# Patient Record
Sex: Female | Born: 1985
Health system: Southern US, Community
[De-identification: ages and names within clinical notes are randomized; demographics above are authoritative.]

## PROBLEM LIST (undated history)

## (undated) ENCOUNTER — Inpatient Hospital Stay (HOSPITAL_COMMUNITY): Payer: Self-pay

## (undated) DIAGNOSIS — F329 Major depressive disorder, single episode, unspecified: Secondary | ICD-10-CM

## (undated) DIAGNOSIS — O468X1 Other antepartum hemorrhage, first trimester: Secondary | ICD-10-CM

## (undated) DIAGNOSIS — O208 Other hemorrhage in early pregnancy: Secondary | ICD-10-CM

## (undated) DIAGNOSIS — F419 Anxiety disorder, unspecified: Secondary | ICD-10-CM

## (undated) DIAGNOSIS — L709 Acne, unspecified: Secondary | ICD-10-CM

## (undated) DIAGNOSIS — O418X10 Other specified disorders of amniotic fluid and membranes, first trimester, not applicable or unspecified: Secondary | ICD-10-CM

## (undated) DIAGNOSIS — N83209 Unspecified ovarian cyst, unspecified side: Secondary | ICD-10-CM

## (undated) HISTORY — DX: Major depressive disorder, single episode, unspecified: F32.9

## (undated) HISTORY — DX: Acne, unspecified: L70.9

## (undated) HISTORY — PX: MOUTH SURGERY: SHX715

## (undated) HISTORY — DX: Anxiety disorder, unspecified: F41.9

---
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
## 2006-06-16 ENCOUNTER — Emergency Department (HOSPITAL_COMMUNITY): Admission: EM | Admit: 2006-06-16 | Discharge: 2006-06-16 | Payer: Self-pay | Admitting: Emergency Medicine

## 2007-04-02 ENCOUNTER — Emergency Department (HOSPITAL_COMMUNITY): Admission: EM | Admit: 2007-04-02 | Discharge: 2007-04-02 | Payer: Self-pay | Admitting: Emergency Medicine

## 2008-07-28 ENCOUNTER — Emergency Department (HOSPITAL_COMMUNITY): Admission: EM | Admit: 2008-07-28 | Discharge: 2008-07-28 | Payer: Self-pay | Admitting: Family Medicine

## 2008-10-11 ENCOUNTER — Emergency Department (HOSPITAL_COMMUNITY): Admission: EM | Admit: 2008-10-11 | Discharge: 2008-10-11 | Payer: Self-pay | Admitting: Emergency Medicine

## 2009-03-28 ENCOUNTER — Emergency Department (HOSPITAL_COMMUNITY): Admission: EM | Admit: 2009-03-28 | Discharge: 2009-03-28 | Payer: Self-pay | Admitting: Family Medicine

## 2009-06-12 ENCOUNTER — Emergency Department (HOSPITAL_COMMUNITY): Admission: EM | Admit: 2009-06-12 | Discharge: 2009-06-12 | Payer: Self-pay | Admitting: Family Medicine

## 2010-08-03 ENCOUNTER — Emergency Department (HOSPITAL_COMMUNITY): Admission: EM | Admit: 2010-08-03 | Discharge: 2010-08-03 | Payer: Self-pay | Admitting: Family Medicine

## 2010-11-02 ENCOUNTER — Emergency Department (HOSPITAL_COMMUNITY)
Admission: EM | Admit: 2010-11-02 | Discharge: 2010-11-02 | Payer: Self-pay | Source: Home / Self Care | Admitting: Family Medicine

## 2011-01-21 LAB — POCT RAPID STREP A (OFFICE): Streptococcus, Group A Screen (Direct): NEGATIVE

## 2011-02-05 ENCOUNTER — Encounter: Payer: Self-pay | Admitting: *Deleted

## 2011-02-19 LAB — POCT RAPID STREP A (OFFICE): Streptococcus, Group A Screen (Direct): POSITIVE — AB

## 2012-11-17 ENCOUNTER — Inpatient Hospital Stay (HOSPITAL_COMMUNITY)
Admission: AD | Admit: 2012-11-17 | Discharge: 2012-11-17 | Disposition: A | Discharge status: Home / Self Care | Payer: 59 | Source: Ambulatory Visit | Attending: Obstetrics & Gynecology | Admitting: Obstetrics & Gynecology

## 2012-11-17 ENCOUNTER — Encounter (HOSPITAL_COMMUNITY): Payer: Self-pay | Admitting: *Deleted

## 2012-11-17 DIAGNOSIS — A084 Viral intestinal infection, unspecified: Secondary | ICD-10-CM

## 2012-11-17 DIAGNOSIS — A088 Other specified intestinal infections: Secondary | ICD-10-CM

## 2012-11-17 DIAGNOSIS — K5289 Other specified noninfective gastroenteritis and colitis: Secondary | ICD-10-CM | POA: Insufficient documentation

## 2012-11-17 DIAGNOSIS — R197 Diarrhea, unspecified: Secondary | ICD-10-CM | POA: Insufficient documentation

## 2012-11-17 DIAGNOSIS — R112 Nausea with vomiting, unspecified: Secondary | ICD-10-CM | POA: Insufficient documentation

## 2012-11-17 DIAGNOSIS — R109 Unspecified abdominal pain: Secondary | ICD-10-CM | POA: Insufficient documentation

## 2012-11-17 HISTORY — DX: Anxiety disorder, unspecified: F41.9

## 2012-11-17 LAB — CBC
HCT: 42.5 % (ref 36.0–46.0)
Hemoglobin: 14.8 g/dL (ref 12.0–15.0)
MCH: 30.1 pg (ref 26.0–34.0)
MCHC: 34.8 g/dL (ref 30.0–36.0)
MCV: 86.6 fL (ref 78.0–100.0)
Platelets: 204 10*3/uL (ref 150–400)
RBC: 4.91 MIL/uL (ref 3.87–5.11)
RDW: 12 % (ref 11.5–15.5)
WBC: 13.3 10*3/uL — ABNORMAL HIGH (ref 4.0–10.5)

## 2012-11-17 LAB — COMPREHENSIVE METABOLIC PANEL
ALT: 13 U/L (ref 0–35)
AST: 17 U/L (ref 0–37)
Albumin: 4 g/dL (ref 3.5–5.2)
Alkaline Phosphatase: 60 U/L (ref 39–117)
BUN: 18 mg/dL (ref 6–23)
CO2: 22 mEq/L (ref 19–32)
Calcium: 10.6 mg/dL — ABNORMAL HIGH (ref 8.4–10.5)
Chloride: 98 mEq/L (ref 96–112)
Creatinine, Ser: 0.62 mg/dL (ref 0.50–1.10)
GFR calc Af Amer: >90 mL/min (ref >90)
GFR calc non Af Amer: >90 mL/min (ref >90)
Glucose, Bld: 180 mg/dL — ABNORMAL HIGH (ref 70–99)
Potassium: 3.3 mEq/L — ABNORMAL LOW (ref 3.5–5.1)
Sodium: 135 mEq/L (ref 135–145)
Total Bilirubin: 1.5 mg/dL — ABNORMAL HIGH (ref 0.3–1.2)
Total Protein: 6.8 g/dL (ref 6.0–8.3)

## 2012-11-17 MED ORDER — DEXTROSE 5 % IN LACTATED RINGERS IV BOLUS
1000.0000 mL | Freq: Once | INTRAVENOUS | Status: AC
  Administered 2012-11-17: 1000 mL via INTRAVENOUS

## 2012-11-17 MED ORDER — ONDANSETRON HCL 4 MG/2ML IJ SOLN
4.0000 mg | Freq: Once | INTRAMUSCULAR | Status: AC
  Administered 2012-11-17: 4 mg via INTRAVENOUS
  Filled 2012-11-17: qty 2

## 2012-11-17 MED ORDER — ONDANSETRON 8 MG PO TBDP: 8.0000 mg | ORAL_TABLET | Freq: Three times a day (TID) | ORAL | Status: DC | PRN

## 2012-11-17 MED ORDER — PROMETHAZINE HCL 25 MG PO TABS: 25.0000 mg | ORAL_TABLET | Freq: Four times a day (QID) | ORAL | Status: DC | PRN

## 2012-11-17 NOTE — MAU Provider Note (Signed)
History     CSN: [id number]  Arrival date and time: 11/17/12 1746   First Provider Initiated Contact with Patient 11/17/12 1818      Chief Complaint  Patient presents with  . Panic Attack  . Emesis  . Diarrhea   HPI Pt is not pregnant- presents with nausea and vomiting and diarrhea.  Pt woke up this morning with nausea and vomiting and has not eaten or drunk anything today.  Pt's family and co-workers have been sick .  She has had some abdominal cramping. Pt had panic attacks with constant vomiting.    Past Medical History  Diagnosis Date  . Anxiety     History reviewed. No pertinent past surgical history.  Family History  Problem Relation Age of Onset  . Other Neg Hx     History  Substance Use Topics  . Smoking status: Never Smoker   . Smokeless tobacco: Not on file  . Alcohol Use: No    Allergies: No Known Allergies  No prescriptions prior to admission    Review of Systems  Constitutional: Negative for fever and chills.  Respiratory: Negative for cough.   Gastrointestinal: Positive for nausea, vomiting, abdominal pain and diarrhea.  Genitourinary: Negative for dysuria, urgency and frequency.   Physical Exam   Blood pressure 96/72, pulse 97, temperature 97.1 F (36.2 C), temperature source Axillary, resp. rate 20, last menstrual period 11/16/2012, SpO2 100.00%.  Physical Exam  Vitals reviewed. Constitutional: She appears well-developed and well-nourished.  HENT:  Head: Normocephalic.  Eyes: Pupils are equal, round, and reactive to light.  Neck: Normal range of motion. Neck supple.  Cardiovascular: Normal rate.   Respiratory: Effort normal. No respiratory distress.  GI: Soft.  Musculoskeletal: Normal range of motion.  Neurological: She is alert.  Skin: Skin is warm and dry.  Psychiatric: She has a normal mood and affect.    MAU Course  Procedures Pt got much relief with 1 liter of D5LR and Zofran 4 mg IV.  Pt has not thrown up or had diarrhea  since she has been in a room. Results for orders placed during the hospital encounter of 11/17/12 (from the past 24 hour(s))  CBC     Status: Abnormal   Collection Time   11/17/12  6:45 PM      Component Value Range   WBC 13.3 (*) 4.0 - 10.5 K/uL   RBC 4.91  3.87 - 5.11 MIL/uL   Hemoglobin 14.8  12.0 - 15.0 g/dL   HCT 08.6  57.8 - 46.9 %   MCV 86.6  78.0 - 100.0 fL   MCH 30.1  26.0 - 34.0 pg   MCHC 34.8  30.0 - 36.0 g/dL   RDW 62.9  52.8 - 41.3 %   Platelets 204  150 - 400 K/uL  COMPREHENSIVE METABOLIC PANEL     Status: Abnormal   Collection Time   11/17/12  6:45 PM      Component Value Range   Sodium 135  135 - 145 mEq/L   Potassium 3.3 (*) 3.5 - 5.1 mEq/L   Chloride 98  96 - 112 mEq/L   CO2 22  19 - 32 mEq/L   Glucose, Bld 180 (*) 70 - 99 mg/dL   BUN 18  6 - 23 mg/dL   Creatinine, Ser 2.44  0.50 - 1.10 mg/dL   Calcium 01.0 (*) 8.4 - 10.5 mg/dL   Total Protein 6.8  6.0 - 8.3 g/dL   Albumin 4.0  3.5 - 5.2  g/dL   AST 17  0 - 37 U/L   ALT 13  0 - 35 U/L   Alkaline Phosphatase 60  39 - 117 U/L   Total Bilirubin 1.5 (*) 0.3 - 1.2 mg/dL   GFR calc non Af Amer >90  >90 mL/min   GFR calc Af Amer >90  >90 mL/min  pt could not obtain a urine specimen Assessment and Plan  Gastroenteritis BRAT diet Prescriptions for Zofran and Phenergan  Tracee Mccreery 11/17/2012, 7:23 PM

## 2012-11-17 NOTE — MAU Note (Addendum)
Pt calmed down, breathing easy, moving hands and all extremities. Unable to get urine.

## 2012-11-17 NOTE — MAU Note (Signed)
Pt brought to rm from lobby via wc.  States n/v/d started this morning.  Pt states hurts all over. S.O. Has been sick. No fever, not preg.  Pt hyperventilating.her her breath into bag.  "can't move her hands"

## 2012-11-17 NOTE — Progress Notes (Signed)
Written and verbal d/c instructions given and understanding voiced. 

## 2014-04-11 ENCOUNTER — Encounter (INDEPENDENT_AMBULATORY_CARE_PROVIDER_SITE_OTHER): Payer: Self-pay | Admitting: *Deleted

## 2014-04-11 NOTE — Telephone Encounter (Signed)
This encounter was created in error - please disregard.

## 2014-09-12 ENCOUNTER — Encounter (HOSPITAL_COMMUNITY): Payer: Self-pay | Admitting: *Deleted

## 2015-06-15 ENCOUNTER — Encounter (INDEPENDENT_AMBULATORY_CARE_PROVIDER_SITE_OTHER): Payer: Self-pay

## 2015-06-15 ENCOUNTER — Encounter: Payer: Self-pay | Admitting: Internal Medicine

## 2015-06-15 ENCOUNTER — Ambulatory Visit (INDEPENDENT_AMBULATORY_CARE_PROVIDER_SITE_OTHER): Payer: 59 | Admitting: Internal Medicine

## 2015-06-15 VITALS — BP 104/70 | HR 68 | Temp 98.2°F | Ht 64.5 in | Wt 122.0 lb

## 2015-06-15 DIAGNOSIS — F418 Other specified anxiety disorders: Secondary | ICD-10-CM | POA: Diagnosis not present

## 2015-06-15 DIAGNOSIS — L709 Acne, unspecified: Secondary | ICD-10-CM

## 2015-06-15 DIAGNOSIS — F419 Anxiety disorder, unspecified: Secondary | ICD-10-CM

## 2015-06-15 DIAGNOSIS — F32A Depression, unspecified: Secondary | ICD-10-CM

## 2015-06-15 DIAGNOSIS — F329 Major depressive disorder, single episode, unspecified: Secondary | ICD-10-CM | POA: Insufficient documentation

## 2015-06-15 HISTORY — DX: Anxiety disorder, unspecified: F41.9

## 2015-06-15 HISTORY — DX: Acne, unspecified: L70.9

## 2015-06-15 HISTORY — DX: Depression, unspecified: F32.A

## 2015-06-15 NOTE — Progress Notes (Signed)
Pt presents to the clinic today to establish care and for management of the conditions listed below. She has not had a PCP in many years.  Anxiety and Depression: She takes Xanax BID. She follows with Dr. Evelene Croon. They have tried other medications but so far the Xanax has been the only one that has been effective. She did have troubled youth but reports she is trying to work through that. She denies SI/HI.  Acne: She takes Doxycycline as needed. She is supposed to be taking it BID but reports she often forgets. She has noticed improvement in her acne.  Flu: 09/2014 Tetanus: 2012 LMP: 06/13/15. G1P1 Pap Smear: 06/2011 Dentist: as needed  Past Medical History  Diagnosis Date  . Anxiety     Current Outpatient Prescriptions  Medication Sig Dispense Refill  . ALPRAZolam (XANAX) 1 MG tablet Take 1 tablet by mouth 2 (two) times daily as needed.  3  . doxycycline (DORYX) 100 MG DR capsule Take 100 mg by mouth 2 (two) times daily. For Acne    . MONONESSA 0.25-35 MG-MCG tablet Take 1 tablet by mouth daily.  6   No current facility-administered medications for this visit.    No Known Allergies  Family History  Problem Relation Age of Onset  . Other Neg Hx     History   Social History  . Marital Status: Married    Spouse Name: N/A  . Number of Children: N/A  . Years of Education: N/A   Occupational History  . Not on file.   Social History Main Topics  . Smoking status: Never Smoker   . Smokeless tobacco: Never Used  . Alcohol Use: 0.0 oz/week    0 Standard drinks or equivalent per week     Comment: occasional  . Drug Use: No  . Sexual Activity: Yes    Birth Control/ Protection: IUD   Other Topics Concern  . Not on file   Social History Narrative     Constitutional: Denies fever, malaise, fatigue, headache or abrupt weight changes.  Respiratory: Denies difficulty breathing, shortness of breath, cough or sputum production.   Cardiovascular: Denies chest pain, chest  tightness, palpitations or swelling in the hands or feet.   Skin: Pt reports acne. Denies redness, lesions or ulcercations.  Neurological: Denies dizziness, difficulty with memory, difficulty with speech or problems with balance and coordination.  Psych: Pt reports anxiety and depression. Denies SI/HI.  No other specific complaints in a complete review of systems (except as listed in HPI above).   BP 104/70 mmHg  Pulse 68  Temp(Src) 98.2 F (36.8 C) (Oral)  Ht 5' 4.5" (1.638 m)  Wt 122 lb (55.339 kg)  BMI 20.63 kg/m2  SpO2 99%  LMP 06/13/2015 Wt Readings from Last 3 Encounters:  06/15/15 122 lb (55.339 kg)    General: Appears her stated age, well developed, well nourished in NAD. Skin: Warm, dry and intact. Acne noted on face. Cardiovascular: Normal rate and rhythm. S1,S2 noted.  No murmur, rubs or gallops noted. Pulmonary/Chest: Normal effort and positive vesicular breath sounds. No respiratory distress. No wheezes, rales or ronchi noted.   Neurological: Alert and oriented.  Psychiatric: She seems happy. She is talkative and makes great eye contact.   BMET    Component Value Date/Time   NA 135 11/17/2012 1845   K 3.3* 11/17/2012 1845   CL 98 11/17/2012 1845   CO2 22 11/17/2012 1845   GLUCOSE 180* 11/17/2012 1845   BUN 18 11/17/2012 1845  CREATININE 0.62 11/17/2012 1845   CALCIUM 10.6* 11/17/2012 1845   GFRNONAA >90 11/17/2012 1845   GFRAA >90 11/17/2012 1845    Lipid Panel  No results found for: CHOL, TRIG, HDL, CHOLHDL, VLDL, LDLCALC  CBC    Component Value Date/Time   WBC 13.3* 11/17/2012 1845   RBC 4.91 11/17/2012 1845   HGB 14.8 11/17/2012 1845   HCT 42.5 11/17/2012 1845   PLT 204 11/17/2012 1845   MCV 86.6 11/17/2012 1845   MCH 30.1 11/17/2012 1845   MCHC 34.8 11/17/2012 1845   RDW 12.0 11/17/2012 1845    Hgb A1C No results found for: HGBA1C

## 2015-06-15 NOTE — Progress Notes (Signed)
Pre visit review using our clinic review tool, if applicable. No additional management support is needed unless otherwise documented below in the visit note. 

## 2015-06-15 NOTE — Assessment & Plan Note (Signed)
She will continue to follow with Dr. Evelene Croon She will continue Xanax BID

## 2015-06-15 NOTE — Patient Instructions (Signed)
Acne  Acne is a skin problem that causes pimples. Acne occurs when the pores in your skin get blocked. Your pores may become red, sore, and swollen (inflamed), or infected with a common skin bacterium (Propionibacterium acnes). Acne is a common skin problem. Up to 80% of people get acne at some time. Acne is especially common from the ages of 12 to 24. Acne usually goes away over time with proper treatment.  CAUSES   Your pores each contain an oil gland. The oil glands make an oily substance called sebum. Acne happens when these glands get plugged with sebum, dead skin cells, and dirt. The P. acnes bacteria that are normally found in the oil glands then multiply, causing inflammation. Acne is commonly triggered by changes in your hormones. These hormonal changes can cause the oil glands to get bigger and to make more sebum. Factors that can make acne worse include:   Hormone changes during adolescence.   Hormone changes during women's menstrual cycles.   Hormone changes during pregnancy.   Oil-based cosmetics and hair products.   Harshly scrubbing the skin.   Strong soaps.   Stress.   Hormone problems due to certain diseases.   Long or oily hair rubbing against the skin.   Certain medicines.   Pressure from headbands, backpacks, or shoulder pads.   Exposure to certain oils and chemicals.  SYMPTOMS   Acne often occurs on the face, neck, chest, and upper back. Symptoms include:   Small, red bumps (pimples or papules).   Whiteheads (closed comedones).   Blackheads (open comedones).   Small, pus-filled pimples (pustules).   Big, red pimples or pustules that feel tender.  More severe acne can cause:   An infected area that contains a collection of pus (abscess).   Hard, painful, fluid-filled sacs (cysts).   Scars.  DIAGNOSIS   Your caregiver can usually tell what the problem is by doing a physical exam.  TREATMENT   There are many good treatments for acne. Some are available over the counter and some  are available with a prescription. The treatment that is best for you depends on the type of acne you have and how severe it is. It may take 2 months of treatment before your acne gets better. Common treatments include:   Creams and lotions that prevent oil glands from clogging.   Creams and lotions that treat or prevent infections and inflammation.   Antibiotics applied to the skin or taken as a pill.   Pills that decrease sebum production.   Birth control pills.   Light or laser treatments.   Minor surgery.   Injections of medicine into the affected areas.   Chemicals that cause peeling of the skin.  HOME CARE INSTRUCTIONS   Good skin care is the most important part of treatment.   Wash your skin gently at least twice a day and after exercise. Always wash your skin before bed.   Use mild soap.   After each wash, apply a water-based skin moisturizer.   Keep your hair clean and off of your face. Shampoo your hair daily.   Only take medicines as directed by your caregiver.   Use a sunscreen or sunblock with SPF 30 or greater. This is especially important when you are using acne medicines.   Choose cosmetics that are noncomedogenic. This means they do not plug the oil glands.   Avoid leaning your chin or forehead on your hands.   Avoid wearing tight headbands or hats.     Avoid picking or squeezing your pimples. This can make your acne worse and cause scarring.  SEEK MEDICAL CARE IF:    Your acne is not better after 8 weeks.   Your acne gets worse.   You have a large area of skin that is red or tender.  Document Released: 10/25/2000 Document Revised: 03/14/2014 Document Reviewed: 08/16/2011  ExitCare Patient Information 2015 [employer]. This information is not intended to replace advice given to you by your health care provider. Make sure you discuss any questions you have with your health care provider.

## 2015-06-15 NOTE — Assessment & Plan Note (Signed)
Encouraged her to try to remember to take her Doxy BID Discussed sun sensitivity while taking Doxy

## 2015-08-30 ENCOUNTER — Telehealth: Payer: Self-pay | Admitting: Internal Medicine

## 2015-08-30 NOTE — Telephone Encounter (Signed)
Patient Name: Anna [name]  DOB: 1986-01-28    Initial Comment Caller states she has been havinShela Commonsg sinus issues for a while. Has tried OTC medications and they arent working. Headaches, earaches, a little facial pain.    Nurse Assessment  Nurse: Annye Englisharmon, RN, Denise Date/Time (Eastern Time): 08/30/2015 4:27:50 PM  Confirm and document reason for call. If symptomatic, describe symptoms. ---Caller states she has been having sinus issues for a while. Has tried OTC medications and they arent working. Headaches, earaches, a little facial pain.  Has the patient traveled out of the country within the last 30 days? ---Not Applicable  Does the patient have any new or worsening symptoms? ---Yes  Will a triage be completed? ---Yes  Related visit to physician within the last 2 weeks? ---No  Does the PT have any chronic conditions? (i.e. diabetes, asthma, etc.) ---No  Did the patient indicate they were pregnant? ---No     Guidelines    Guideline Title Affirmed Question Affirmed Notes  Sinus Pain or Congestion [1] SEVERE pain AND [2] not improved 2 hours after pain medicine    Final Disposition User   See Physician within 4 Hours (or PCP triage) Annye Englisharmon, RN, Angelique Blonderenise    Comments  [facility]. States she will call in the am to get an appt to be eval at the [facility].   Referrals  GO TO FACILITY REFUSED   Disagree/Comply: Comply

## 2015-10-30 ENCOUNTER — Telehealth: Payer: Self-pay | Admitting: Internal Medicine

## 2015-10-30 NOTE — Telephone Encounter (Signed)
Pt has appt 10/31/15 at 9 AM with R Baity NP.

## 2015-10-30 NOTE — Telephone Encounter (Signed)
[facility] Client TELEPHONE ADVICE RECORD [facility]  Patient Name: Anna [name]  DOB: 1985/12/31    Initial Comment Caller states has had cold s/s for 2 months.    Nurse Assessment  Nurse: Laural BenesJohnson, RN, Dondra SpryGail Date/Time Lamount Cohen(Eastern Time): 10/30/2015 10:33:35 AM  Confirm and document reason for call. If symptomatic, describe symptoms. ---[name] has drainage and cough developed congestion runny nose with nose bleed x 3 days never had high temp then started having a new symptoms -- now has cough, congestion no fever and onset two months sore throat with this also has rash on face  Has the patient traveled out of the country within the last 30 days? ---No  Does the patient have any new or worsening symptoms? ---Yes  Will a triage be completed? ---Yes  Related visit to physician within the last 2 weeks? ---No  Does the PT have any chronic conditions? (i.e. diabetes, asthma, etc.) ---Unknown  Did the patient indicate they were pregnant? ---No  Is this a behavioral health or substance abuse call? ---No     Guidelines    Guideline Title Affirmed Question Affirmed Notes  Cough - Chronic Change in color of sputum (e.g., from white to yellow-green sputum)    Final Disposition User   See Physician within 24 Hours [place]Johnson, RN, Dondra SpryGail    Comments  NOTE APPT FOR 10-31-2015 9AM Nicki ReaperEGINA BAITY, NP   Referrals  REFERRED TO PCP OFFICE   Disagree/Comply: Comply

## 2015-10-31 ENCOUNTER — Ambulatory Visit (INDEPENDENT_AMBULATORY_CARE_PROVIDER_SITE_OTHER): Payer: 59 | Admitting: Internal Medicine

## 2015-10-31 ENCOUNTER — Encounter: Payer: Self-pay | Admitting: Internal Medicine

## 2015-10-31 VITALS — BP 104/68 | HR 68 | Temp 97.8°F | Wt 119.0 lb

## 2015-10-31 DIAGNOSIS — J309 Allergic rhinitis, unspecified: Secondary | ICD-10-CM

## 2015-10-31 MED ORDER — PREDNISONE 10 MG PO TABS: ORAL_TABLET | ORAL | Status: DC

## 2015-10-31 NOTE — Progress Notes (Signed)
Pre visit review using our clinic review tool, if applicable. No additional management support is needed unless otherwise documented below in the visit note. 

## 2015-10-31 NOTE — Progress Notes (Signed)
Subjective:    Patient ID: Anna [name], female    DOB: 11-30-1985, 29 y.o.   MRN: [id number]  HPI  Pt presents to the clinic today with c/o runny nose, sore throat and cough. This is persistent over the las 2 months. It has not gotten worse but she reports it will not go away. She is blowing clear, bloody mucous out of her nose. The cough is nonproductive. She denies fever or chills but had body aches. She has tried Claritin, Flonase, Sudafed and Tylenol with minimal relief. She has no history of allergies that she is aware of. She has not had sick contact.  Review of Systems      Past Medical History  Diagnosis Date  . Anxiety     Current Outpatient Prescriptions  Medication Sig Dispense Refill  . MONONESSA 0.25-35 MG-MCG tablet Take 1 tablet by mouth daily.  6  . predniSONE (DELTASONE) 10 MG tablet Take 3 tabs on days 1-3, take 2 tabs on days 4-6, take 1 tab on days 7-9 18 tablet 0   No current facility-administered medications for this visit.    No Known Allergies  Family History  Problem Relation Age of Onset  . Other Neg Hx     Social History   Social History  . Marital Status: Married    Spouse Name: N/A  . Number of Children: N/A  . Years of Education: N/A   Occupational History  . Not on file.   Social History Main Topics  . Smoking status: Never Smoker   . Smokeless tobacco: Never Used  . Alcohol Use: 0.0 oz/week    0 Standard drinks or equivalent per week     Comment: occasional  . Drug Use: No  . Sexual Activity: Yes    Birth Control/ Protection: IUD, Pill   Other Topics Concern  . Not on file   Social History Narrative     Constitutional: Denies fever, malaise, fatigue, headache or abrupt weight changes.  HEENT: Pt reports runny nose and sore throat. Denies eye pain, eye redness, ear pain, ringing in the ears, wax buildup, nasal congestion, bloody nose. Respiratory: Pt reports cough. Denies difficulty breathing, shortness of breath, or  sputum production.   Cardiovascular: Denies chest pain, chest tightness, palpitations or swelling in the hands or feet.   No other specific complaints in a complete review of systems (except as listed in HPI above).  Objective:   Physical Exam  BP 104/68 mmHg  Pulse 68  Temp(Src) 97.8 F (36.6 C) (Oral)  Wt 119 lb (53.978 kg)  LMP 10/09/2015 Wt Readings from Last 3 Encounters:  10/31/15 119 lb (53.978 kg)  06/15/15 122 lb (55.339 kg)    General: Appears her stated age, well developed, well nourished in NAD. HEENT: Head: normal shape and size, no sinus tenderness noted; Eyes: sclera white, no icterus, conjunctiva pink; Ears: Tm's pink but intact, normal light reflex, + serous effusion bilaterally; Nose: mucosa pink and moist, septum midline; Throat/Mouth: Teeth present, mucosa pink and moist, + PND, no exudate, lesions or ulcerations noted.  Neck:  No adenopathy noted. Cardiovascular: Normal rate and rhythm. S1,S2 noted.  No murmur, rubs or gallops noted.  Pulmonary/Chest: Normal effort and positive vesicular breath sounds. No respiratory distress. No wheezes, rales or ronchi noted.   BMET    Component Value Date/Time   NA 135 11/17/2012 1845   K 3.3* 11/17/2012 1845   CL 98 11/17/2012 1845   CO2 22 11/17/2012 1845  GLUCOSE 180* 11/17/2012 1845   BUN 18 11/17/2012 1845   CREATININE 0.62 11/17/2012 1845   CALCIUM 10.6* 11/17/2012 1845   GFRNONAA >90 11/17/2012 1845   GFRAA >90 11/17/2012 1845    Lipid Panel  No results found for: CHOL, TRIG, HDL, CHOLHDL, VLDL, LDLCALC  CBC    Component Value Date/Time   WBC 13.3* 11/17/2012 1845   RBC 4.91 11/17/2012 1845   HGB 14.8 11/17/2012 1845   HCT 42.5 11/17/2012 1845   PLT 204 11/17/2012 1845   MCV 86.6 11/17/2012 1845   MCH 30.1 11/17/2012 1845   MCHC 34.8 11/17/2012 1845   RDW 12.0 11/17/2012 1845    Hgb A1C No results found for: HGBA1C       Assessment & Plan:   Allergic Rhinitis:  Failed antihistamine and  nasal steroid Try switching from Claritin to Allegra eRx for Pred Taper x 9 days Return precautions given  RTC as needed or if symptoms persist or worsen

## 2015-10-31 NOTE — Patient Instructions (Signed)
Allergic Rhinitis Allergic rhinitis is when the mucous membranes in the nose respond to allergens. Allergens are particles in the air that cause your body to have an allergic reaction. This causes you to release allergic antibodies. Through a chain of events, these eventually cause you to release histamine into the blood stream. Although meant to protect the body, it is this release of histamine that causes your discomfort, such as frequent sneezing, congestion, and an itchy, runny nose.  CAUSES Seasonal allergic rhinitis (hay fever) is caused by pollen allergens that may come from grasses, trees, and weeds. Year-round allergic rhinitis (perennial allergic rhinitis) is caused by allergens such as house dust mites, pet dander, and mold spores. SYMPTOMS  Nasal stuffiness (congestion).  Itchy, runny nose with sneezing and tearing of the eyes. DIAGNOSIS Your health care provider can help you determine the allergen or allergens that trigger your symptoms. If you and your health care provider are unable to determine the allergen, skin or blood testing may be used. Your health care provider will diagnose your condition after taking your health history and performing a physical exam. Your health care provider may assess you for other related conditions, such as asthma, pink eye, or an ear infection. TREATMENT Allergic rhinitis does not have a cure, but it can be controlled by:  Medicines that block allergy symptoms. These may include allergy shots, nasal sprays, and oral antihistamines.  Avoiding the allergen. Hay fever may often be treated with antihistamines in pill or nasal spray forms. Antihistamines block the effects of histamine. There are over-the-counter medicines that may help with nasal congestion and swelling around the eyes. Check with your health care provider before taking or giving this medicine. If avoiding the allergen or the medicine prescribed do not work, there are many new medicines  your health care provider can prescribe. Stronger medicine may be used if initial measures are ineffective. Desensitizing injections can be used if medicine and avoidance does not work. Desensitization is when a patient is given ongoing shots until the body becomes less sensitive to the allergen. Make sure you follow up with your health care provider if problems continue. HOME CARE INSTRUCTIONS It is not possible to completely avoid allergens, but you can reduce your symptoms by taking steps to limit your exposure to them. It helps to know exactly what you are allergic to so that you can avoid your specific triggers. SEEK MEDICAL CARE IF:  You have a fever.  You develop a cough that does not stop easily (persistent).  You have shortness of breath.  You start wheezing.  Symptoms interfere with normal daily activities.   This information is not intended to replace advice given to you by your health care provider. Make sure you discuss any questions you have with your health care provider.   Document Released: 07/23/2001 Document Revised: 11/18/2014 Document Reviewed: 07/05/2013 Elsevier Interactive Patient Education 2016 [employer]  

## 2016-02-02 DIAGNOSIS — J069 Acute upper respiratory infection, unspecified: Secondary | ICD-10-CM | POA: Diagnosis not present

## 2016-02-02 DIAGNOSIS — B9689 Other specified bacterial agents as the cause of diseases classified elsewhere: Secondary | ICD-10-CM | POA: Diagnosis not present

## 2016-02-02 DIAGNOSIS — J019 Acute sinusitis, unspecified: Secondary | ICD-10-CM | POA: Diagnosis not present

## 2016-03-26 ENCOUNTER — Telehealth: Payer: Self-pay | Admitting: Internal Medicine

## 2016-03-26 ENCOUNTER — Encounter: Payer: Self-pay | Admitting: Family Medicine

## 2016-03-26 ENCOUNTER — Ambulatory Visit (INDEPENDENT_AMBULATORY_CARE_PROVIDER_SITE_OTHER): Payer: 59 | Admitting: Family Medicine

## 2016-03-26 VITALS — BP 118/70 | HR 80 | Temp 98.0°F | Wt 121.2 lb

## 2016-03-26 DIAGNOSIS — R21 Rash and other nonspecific skin eruption: Secondary | ICD-10-CM | POA: Diagnosis not present

## 2016-03-26 MED ORDER — TRIAMCINOLONE ACETONIDE 0.1 % EX CREA: 1.0000 "application " | TOPICAL_CREAM | Freq: Two times a day (BID) | CUTANEOUS | Status: DC

## 2016-03-26 MED ORDER — PERMETHRIN 5 % EX CREA: 1.0000 "application " | TOPICAL_CREAM | Freq: Once | CUTANEOUS | Status: DC

## 2016-03-26 MED ORDER — PREDNISONE 20 MG PO TABS: ORAL_TABLET | ORAL | Status: DC

## 2016-03-26 NOTE — Assessment & Plan Note (Signed)
Rash may have started after she was outdoors cutting tree at home ?exposure to poison ivy/oak. rec treat with prednisone course + TCI cream. Ok to continue benadryl.  If no better with treatment, will trial permethrin cream to treat possible scabies given puritic nature of condition (although not in typical distribution for scabies).  No systemic sxs.

## 2016-03-26 NOTE — Telephone Encounter (Signed)
Pt has appt with Dr Reece AgarG 03/26/16 at 10:45.

## 2016-03-26 NOTE — Progress Notes (Signed)
   BP 118/70 mmHg  Pulse 80  Temp(Src) 98 F (36.7 C) (Oral)  Wt 121 lb 4 oz (54.999 kg)  LMP 03/10/2016   CC: skin rash  Subjective:    Patient ID: Anna [name], female    DOB: 1986/03/14, 30 y.o.   MRN: [id number]  HPI: Anna CommonsJudith Lawes is a 30 y.o. female presenting on 03/26/2016 for Rash   Several week ago noticed itching of back associated with bumps and burning. Rash on back spread some to neck, chest. No rash on arms or legs. Some finger paresthesias when very itchy.   She has been outside cutting down tree at home but no bonfires, no known poison ivy exposure.  No new foods, no new medicines.  No new lotions, detergents, soaps or shampoos. No new chemical exposures.  No other skin rashes around contacts.  Self treated with benadryl. Also using apple cider vinegar which has helped.   No fevers/chills, nausea, oral lesions.   Relevant past medical, surgical, family and social history reviewed and updated as indicated. Interim medical history since our last visit reviewed. Allergies and medications reviewed and updated. No current outpatient prescriptions on file prior to visit.   No current facility-administered medications on file prior to visit.    Review of Systems Per HPI unless specifically indicated in ROS section     Objective:    BP 118/70 mmHg  Pulse 80  Temp(Src) 98 F (36.7 C) (Oral)  Wt 121 lb 4 oz (54.999 kg)  LMP 03/10/2016  Wt Readings from Last 3 Encounters:  03/26/16 121 lb 4 oz (54.999 kg)  10/31/15 119 lb (53.978 kg)  06/15/15 122 lb (55.339 kg)    Physical Exam  Constitutional: She appears well-developed and well-nourished. No distress.  Musculoskeletal: She exhibits no edema.  Skin: Skin is warm and dry. Rash noted. No erythema.  Diffuse papular rash throughout upper neck/back and anterior upper chest, papules present through chin and behind ears as well. Intensely pruritic. Nonvesicular.  Spares extremities - arms, hands, feet, legs,  and rest of head  Nursing note and vitals reviewed.     Assessment & Plan:   Problem List Items Addressed This Visit    Rash and nonspecific skin eruption - Primary    Rash may have started after she was outdoors cutting tree at home ?exposure to poison ivy/oak. rec treat with prednisone course + TCI cream. Ok to continue benadryl.  If no better with treatment, will trial permethrin cream to treat possible scabies given puritic nature of condition (although not in typical distribution for scabies).  No systemic sxs.          Follow up plan: Return if symptoms worsen or fail to improve.  Eustaquio BoydenJavier Emberlee Sortino, MD

## 2016-03-26 NOTE — Progress Notes (Signed)
Pre visit review using our clinic review tool, if applicable. No additional management support is needed unless otherwise documented below in the visit note. 

## 2016-03-26 NOTE — Telephone Encounter (Signed)
Patient Name: Anna CommonsJUDITH Salay DOB: September 01, 1986 Initial Comment Caller States has bumpy rash, burning, itchy for last 3 wks, back, chest, nape of neck, lower part of face Nurse Assessment Nurse: Yetta BarreJones, RN, Miranda Date/Time (Eastern Time): 03/26/2016 8:55:52 AM Confirm and document reason for call. If symptomatic, describe symptoms. You must click the next button to save text entered. ---Caller states for the last 3 weeks, she has had red bumps on her skin that burn and itch. The rash is on her neck, jaw line, and upper back. Has the patient traveled out of the country within the last 30 days? ---No Does the patient have any new or worsening symptoms? ---Yes Will a triage be completed? ---Yes Related visit to physician within the last 2 weeks? ---No Does the PT have any chronic conditions? (i.e. diabetes, asthma, etc.) ---Yes List chronic conditions. ---Allergies Is the patient pregnant or possibly pregnant? (Ask all females between the ages of 5212-55) ---No Is this a behavioral health or substance abuse call? ---No Guidelines Guideline Title Affirmed Question Affirmed Notes Rash or Redness - Localized [1] Localized rash is very painful AND [2] no fever Final Disposition User See Physician within 24 Hours Yetta BarreJones, RN, Miranda Comments Appt scheduled with Dr. Sharen HonesGutierrez at 10:45am today at patient's request. No appt available with PCP within recommended time frame. Referrals REFERRED TO PCP OFFICE Disagree/Comply: Comply

## 2016-03-26 NOTE — Patient Instructions (Signed)
I think this is contact dermatitis to something you were exposed to outdoors. Treat with prednisone course as well as topical steroid.  Ok to continue benadryl for itch. Warm water soaks can help itching as well. If no better with this, treat with permethrin cream (printed out today) for possible scabies infection.  Let us know if not improving with treatment.

## 2016-04-01 ENCOUNTER — Telehealth: Payer: Self-pay | Admitting: Internal Medicine

## 2016-04-01 NOTE — Telephone Encounter (Signed)
The rash is not any better.  Patient stated that today is the last day of the Prednisone. Patient wants to know she should continue the cream that she was given first or should she go ahead and use the second cream given for scabies?

## 2016-04-01 NOTE — Telephone Encounter (Signed)
Trial permethrin. If no better would offer derm referral.

## 2016-04-01 NOTE — Telephone Encounter (Signed)
Pt called with questions regarding cream given at previous appt. There hasn't been any change in rash, possibly spreading more. Please call to discuss second cream given. If after 4:30 call [phone].

## 2016-04-02 NOTE — Telephone Encounter (Signed)
Patient notified and verbalized understanding. 

## 2016-04-03 ENCOUNTER — Telehealth: Payer: Self-pay | Admitting: *Deleted

## 2016-04-03 DIAGNOSIS — R21 Rash and other nonspecific skin eruption: Secondary | ICD-10-CM

## 2016-04-03 NOTE — Telephone Encounter (Signed)
Patient called requesting derm referral. Rash seems to be spreading and is more painful. Has used the 2nd round of treatment.

## 2016-04-04 ENCOUNTER — Ambulatory Visit (INDEPENDENT_AMBULATORY_CARE_PROVIDER_SITE_OTHER): Payer: 59 | Admitting: Internal Medicine

## 2016-04-04 ENCOUNTER — Encounter: Payer: Self-pay | Admitting: Internal Medicine

## 2016-04-04 ENCOUNTER — Telehealth: Payer: Self-pay | Admitting: Internal Medicine

## 2016-04-04 VITALS — BP 120/82 | HR 74 | Temp 98.8°F | Wt 120.0 lb

## 2016-04-04 DIAGNOSIS — R21 Rash and other nonspecific skin eruption: Secondary | ICD-10-CM

## 2016-04-04 MED ORDER — TETRACYCLINE HCL 500 MG PO CAPS: 500.0000 mg | ORAL_CAPSULE | Freq: Two times a day (BID) | ORAL | Status: DC

## 2016-04-04 NOTE — Patient Instructions (Signed)

## 2016-04-04 NOTE — Progress Notes (Signed)
Pre visit review using our clinic review tool, if applicable. No additional management support is needed unless otherwise documented below in the visit note. 

## 2016-04-04 NOTE — Telephone Encounter (Signed)
 [facility] - Client TELEPHONE ADVICE RECORD [facility] Patient Name: Anna [name] DOB: 1986-01-06 Initial Comment Caller states c/o widespread rash, very painful Nurse Assessment Nurse: Debera Latalston, RN, Tinnie GensJeffrey Date/Time (Eastern Time): 04/04/2016 8:17:31 AM Confirm and document reason for call. If symptomatic, describe symptoms. You must click the next button to save text entered. ---Caller states c/o widespread rash, very painful. Rash since the first of May. Has been seen by PCP and was prescribed meds. Has the patient traveled out of the country within the last 30 days? ---No Does the patient have any new or worsening symptoms? ---Yes Will a triage be completed? ---Yes Related visit to physician within the last 2 weeks? ---No Does the PT have any chronic conditions? (i.e. diabetes, asthma, etc.) ---No Is the patient pregnant or possibly pregnant? (Ask all females between the ages of 7212-55) ---No Is this a behavioral health or substance abuse call? ---No Guidelines Guideline Title Affirmed Question Affirmed Notes Rash or Redness - Widespread SEVERE itching (i.e., interferes with sleep, normal activities or school) Final Disposition User See Physician within 24 Hours Debera Latalston, RN, Abbott LaboratoriesJeffrey Referrals REFERRED TO PCP OFFICE Disagree/Comply: Danella Maiersomply

## 2016-04-04 NOTE — Progress Notes (Signed)
Subjective:    Patient ID: Anna [name], female    DOB: [birth year], 30 y.o.   MRN: [id number]  HPI  Pt presents to the clinic today with c/o rash. This has been going on for 3 weeks. She first noticed after working out in the yard, but did not come in contact with any known poison ivy/oak. She saw Dr. Sharen [name] for the same 03/26/16- note reviewed. He put her on Prednisone and Triamcinolone for a possible contact dermatitis. She did not notice any improvement, so he sent in Permethrin for possible scabies infection. She has used the Permethrin x 2 and reports the rash continues to spread and is painful. Dr. Sharen [name] placed a referral to dermatology yesterday, but she had not yet scheduled an appointment. She denies changes in soaps, lotions, detergents, medications or diet.  Review of Systems      Past Medical History  Diagnosis Date  . Anxiety     Current Outpatient Prescriptions  Medication Sig Dispense Refill  . fexofenadine (ALLEGRA) 180 MG tablet Take 180 mg by mouth daily.    . norethindrone (NORA-BE) 0.35 MG tablet Take 1 tablet by mouth daily.    . permethrin (ACTICIN) 5 % cream Apply 1 application topically once. (Patient not taking: Reported on 04/04/2016) 60 g 0  . triamcinolone cream (KENALOG) 0.1 % Apply 1 application topically 2 (two) times daily. Apply to AA. Max 2 wks at a time (Patient not taking: Reported on 04/04/2016) 45 g 0   No current facility-administered medications for this visit.    No Known Allergies  Family History  Problem Relation Age of Onset  . Other Neg Hx     Social History   Social History  . Marital Status: Married    Spouse Name: N/A  . Number of Children: N/A  . Years of Education: N/A   Occupational History  . Not on file.   Social History Main Topics  . Smoking status: Never Smoker   . Smokeless tobacco: Never Used  . Alcohol Use: 0.0 oz/week    0 Standard drinks or equivalent per week     Comment: occasional  . Drug Use: No   . Sexual Activity: Yes    Birth Control/ Protection: IUD, Pill   Other Topics Concern  . Not on file   Social History Narrative     Constitutional: Denies fever, malaise, fatigue, headache or abrupt weight changes.  Skin: Pt reports rash. Denies ulcercations.    No other specific complaints in a complete review of systems (except as listed in HPI above).  Objective:   Physical Exam   BP 120/82 mmHg  Pulse 74  Temp(Src) 98.8 F (37.1 C) (Oral)  Wt 120 lb (54.432 kg)  SpO2 98%  LMP 03/10/2016 Wt Readings from Last 3 Encounters:  04/04/16 120 lb (54.432 kg)  03/26/16 121 lb 4 oz (54.999 kg)  10/31/15 119 lb (53.978 kg)    General: Appears her stated age, well developed, well nourished in NAD. Skin: Scattered, pustular lesions of various sizes on erythematous base, noted on neck, back, shoulders and upper chest.  BMET    Component Value Date/Time   NA 135 11/17/2012 1845   K 3.3* 11/17/2012 1845   CL 98 11/17/2012 1845   CO2 22 11/17/2012 1845   GLUCOSE 180* 11/17/2012 1845   BUN 18 11/17/2012 1845   CREATININE 0.62 11/17/2012 1845   CALCIUM 10.6* 11/17/2012 1845   GFRNONAA >90 11/17/2012 1845   GFRAA >90 11/17/2012 1845  Lipid Panel  No results found for: CHOL, TRIG, HDL, CHOLHDL, VLDL, LDLCALC  CBC    Component Value Date/Time   WBC 13.3* 11/17/2012 1845   RBC 4.91 11/17/2012 1845   HGB 14.8 11/17/2012 1845   HCT 42.5 11/17/2012 1845   PLT 204 11/17/2012 1845   MCV 86.6 11/17/2012 1845   MCH 30.1 11/17/2012 1845   MCHC 34.8 11/17/2012 1845   RDW 12.0 11/17/2012 1845    Hgb A1C No results found for: HGBA1C      Assessment & Plan:   Rash:  Seems like inflammatory acne No improvement with steroid cream, oral steroid and Permethrin eRx for Tetracycline 500 mg BID x 10 days See Shirlee LimerickMarion on the way out to set up your dermatology referral in case symptoms persist  RTC as needed or if symptoms persist or worsen

## 2016-04-04 NOTE — Telephone Encounter (Signed)
Referral placed.

## 2016-04-04 NOTE — Telephone Encounter (Signed)
Pt has appt with Pamala Hurry Baity NP on 04/04/16 at 10:30.

## 2016-04-19 DIAGNOSIS — L308 Other specified dermatitis: Secondary | ICD-10-CM | POA: Diagnosis not present

## 2016-06-12 ENCOUNTER — Other Ambulatory Visit: Payer: 59

## 2016-06-17 ENCOUNTER — Encounter: Payer: 59 | Admitting: Internal Medicine

## 2016-06-17 ENCOUNTER — Telehealth: Payer: Self-pay | Admitting: Internal Medicine

## 2016-06-17 DIAGNOSIS — Z0289 Encounter for other administrative examinations: Secondary | ICD-10-CM

## 2016-06-17 DIAGNOSIS — L700 Acne vulgaris: Secondary | ICD-10-CM | POA: Diagnosis not present

## 2016-06-17 NOTE — Telephone Encounter (Signed)
Patient did not come in for their appointment today for cpe.  Please let me know if patient needs to be contacted immediately for follow up or no follow up needed. °

## 2016-06-17 NOTE — Telephone Encounter (Signed)
Yes, she needs to reschedule appt

## 2016-06-18 NOTE — Telephone Encounter (Signed)
Called to rs appt - left message

## 2016-06-19 NOTE — Telephone Encounter (Signed)
Called to rs appt - l/m

## 2016-07-02 ENCOUNTER — Ambulatory Visit (INDEPENDENT_AMBULATORY_CARE_PROVIDER_SITE_OTHER): Payer: 59 | Admitting: Internal Medicine

## 2016-07-02 ENCOUNTER — Encounter: Payer: Self-pay | Admitting: Internal Medicine

## 2016-07-02 ENCOUNTER — Ambulatory Visit: Payer: 59 | Admitting: Internal Medicine

## 2016-07-02 VITALS — BP 108/64 | HR 87 | Temp 98.8°F | Wt 119.0 lb

## 2016-07-02 DIAGNOSIS — T3695XA Adverse effect of unspecified systemic antibiotic, initial encounter: Secondary | ICD-10-CM

## 2016-07-02 DIAGNOSIS — J069 Acute upper respiratory infection, unspecified: Secondary | ICD-10-CM | POA: Diagnosis not present

## 2016-07-02 DIAGNOSIS — B379 Candidiasis, unspecified: Secondary | ICD-10-CM

## 2016-07-02 MED ORDER — FLUCONAZOLE 150 MG PO TABS: 150.0000 mg | ORAL_TABLET | Freq: Once | ORAL | 0 refills | Status: AC

## 2016-07-02 MED ORDER — AZITHROMYCIN 250 MG PO TABS: ORAL_TABLET | ORAL | 0 refills | Status: DC

## 2016-07-02 NOTE — Patient Instructions (Signed)
Upper Respiratory Infection, Adult Most upper respiratory infections (URIs) are a viral infection of the air passages leading to the lungs. A URI affects the nose, throat, and upper air passages. The most common type of URI is nasopharyngitis and is typically referred to as "the common cold." URIs run their course and usually go away on their own. Most of the time, a URI does not require medical attention, but sometimes a bacterial infection in the upper airways can follow a viral infection. This is called a secondary infection. Sinus and middle ear infections are common types of secondary upper respiratory infections. Bacterial pneumonia can also complicate a URI. A URI can worsen asthma and chronic obstructive pulmonary disease (COPD). Sometimes, these complications can require emergency medical care and may be life threatening.  CAUSES Almost all URIs are caused by viruses. A virus is a type of germ and can spread from one person to another.  RISKS FACTORS You may be at risk for a URI if:   You smoke.   You have chronic heart or lung disease.  You have a weakened defense (immune) system.   You are very young or very old.   You have nasal allergies or asthma.  You work in crowded or poorly ventilated areas.  You work in health care facilities or schools. SIGNS AND SYMPTOMS  Symptoms typically develop 2-3 days after you come in contact with a cold virus. Most viral URIs last 7-10 days. However, viral URIs from the influenza virus (flu virus) can last 14-18 days and are typically more severe. Symptoms may include:   Runny or stuffy (congested) nose.   Sneezing.   Cough.   Sore throat.   Headache.   Fatigue.   Fever.   Loss of appetite.   Pain in your forehead, behind your eyes, and over your cheekbones (sinus pain).  Muscle aches.  DIAGNOSIS  Your health care provider may diagnose a URI by:  Physical exam.  Tests to check that your symptoms are not due to  another condition such as:  Strep throat.  Sinusitis.  Pneumonia.  Asthma. TREATMENT  A URI goes away on its own with time. It cannot be cured with medicines, but medicines may be prescribed or recommended to relieve symptoms. Medicines may help:  Reduce your fever.  Reduce your cough.  Relieve nasal congestion. HOME CARE INSTRUCTIONS   Take medicines only as directed by your health care provider.   Gargle warm saltwater or take cough drops to comfort your throat as directed by your health care provider.  Use a warm mist humidifier or inhale steam from a shower to increase air moisture. This may make it easier to breathe.  Drink enough fluid to keep your urine clear or pale yellow.   Eat soups and other clear broths and maintain good nutrition.   Rest as needed.   Return to work when your temperature has returned to normal or as your health care provider advises. You may need to stay home longer to avoid infecting others. You can also use a face mask and careful hand washing to prevent spread of the virus.  Increase the usage of your inhaler if you have asthma.   Do not use any tobacco products, including cigarettes, chewing tobacco, or electronic cigarettes. If you need help quitting, ask your health care provider. PREVENTION  The best way to protect yourself from getting a cold is to practice good hygiene.   Avoid oral or hand contact with people with cold   symptoms.   Wash your hands often if contact occurs.  There is no clear evidence that vitamin C, vitamin E, echinacea, or exercise reduces the chance of developing a cold. However, it is always recommended to get plenty of rest, exercise, and practice good nutrition.  SEEK MEDICAL CARE IF:   You are getting worse rather than better.   Your symptoms are not controlled by medicine.   You have chills.  You have worsening shortness of breath.  You have brown or red mucus.  You have yellow or brown nasal  discharge.  You have pain in your face, especially when you bend forward.  You have a fever.  You have swollen neck glands.  You have pain while swallowing.  You have white areas in the back of your throat. SEEK IMMEDIATE MEDICAL CARE IF:   You have severe or persistent:  Headache.  Ear pain.  Sinus pain.  Chest pain.  You have chronic lung disease and any of the following:  Wheezing.  Prolonged cough.  Coughing up blood.  A change in your usual mucus.  You have a stiff neck.  You have changes in your:  Vision.  Hearing.  Thinking.  Mood. MAKE SURE YOU:   Understand these instructions.  Will watch your condition.  Will get help right away if you are not doing well or get worse.   This information is not intended to replace advice given to you by your health care provider. Make sure you discuss any questions you have with your health care provider.   Document Released: 04/23/2001 Document Revised: 03/14/2015 Document Reviewed: 02/02/2014 Elsevier Interactive Patient Education 2016 [employer] Inc[employer]  

## 2016-07-02 NOTE — Progress Notes (Signed)
HPI  Pt presents to the clinic today with c/o nasal congestion, ear fullness, sore throat, cough and chest congestion. She reports this started 2-3 weeks ago. She is blowing thick, green mucous out of her nose. The cough is productive of green mucous. She denies chest tightness or shortness of breath, but does feel a burning sensation in her throat. She denies fever, chills or body aches. She has tried Careers adviserAllegra, Dayquil and Tylenol with minimal relief. She has not had sick contacts that she is aware of.  Review of Systems      Past Medical History:  Diagnosis Date  . Anxiety     Family History  Problem Relation Age of Onset  . Other Neg Hx     Social History   Social History  . Marital status: Married    Spouse name: N/A  . Number of children: N/A  . Years of education: N/A   Occupational History  . Not on file.   Social History Main Topics  . Smoking status: Never Smoker  . Smokeless tobacco: Never Used  . Alcohol use 0.0 oz/week     Comment: occasional  . Drug use: No  . Sexual activity: Yes    Birth control/ protection: IUD, Pill   Other Topics Concern  . Not on file   Social History Narrative  . No narrative on file    No Known Allergies   Constitutional: Denies headache, fatigue, fever or abrupt weight changes.  HEENT:  Positive ear fullness, nasal congestion, sore throat. Denies eye redness, eye pain, pressure behind the eyes, facial pain, ear pain, ringing in the ears, wax buildup, runny nose or bloody nose. Respiratory: Positive cough. Denies difficulty breathing or shortness of breath.  Cardiovascular: Denies chest pain, chest tightness, palpitations or swelling in the hands or feet.   No other specific complaints in a complete review of systems (except as listed in HPI above).  Objective:   BP 108/64   Pulse 87   Temp 98.8 F (37.1 C) (Oral)   Wt 119 lb (54 kg)   LMP 06/11/2016   SpO2 98%   BMI 20.11 kg/m  Wt Readings from Last 3 Encounters:   07/02/16 119 lb (54 kg)  04/04/16 120 lb (54.4 kg)  03/26/16 121 lb 4 oz (55 kg)     General: Appears her stated age,  in NAD. HEENT: Head: normal shape and size, no sinus tenderness noted; Eyes: sclera white, no icterus, conjunctiva pink; Ears: Tm's pink but intact, normal light reflex, + serous effusion bilaterally; Nose: mucosa pink and moist, septum midline; Throat/Mouth: + PND. Teeth present, mucosa pink and moist, no exudate noted, no lesions or ulcerations noted.  Neck: No cervical lymphadenopathy.  Cardiovascular: Normal rate and rhythm. S1,S2 noted.  No murmur, rubs or gallops noted.  Pulmonary/Chest: Normal effort and positive vesicular breath sounds. No respiratory distress. No wheezes, rales or ronchi noted.      Assessment & Plan:   Upper Respiratory Infection:  Get some rest and drink plenty of water Do salt water gargles for the sore throat eRx for Azithromax x 5 days Delsym as needed for cough eRx for Diflucan for antibiotic induced yeast infection  RTC as needed or if symptoms persist.   Nicki ReaperBAITY, Landyn Buckalew, NP

## 2016-07-05 ENCOUNTER — Encounter: Payer: Self-pay | Admitting: Internal Medicine

## 2016-07-05 NOTE — Telephone Encounter (Signed)
Sent letter to pt to rs appt  °

## 2016-07-08 DIAGNOSIS — L500 Allergic urticaria: Secondary | ICD-10-CM | POA: Diagnosis not present

## 2016-08-08 ENCOUNTER — Ambulatory Visit (INDEPENDENT_AMBULATORY_CARE_PROVIDER_SITE_OTHER): Payer: 59 | Admitting: Family Medicine

## 2016-08-08 ENCOUNTER — Encounter: Payer: Self-pay | Admitting: Family Medicine

## 2016-08-08 VITALS — BP 110/88 | HR 88 | Wt 119.0 lb

## 2016-08-08 DIAGNOSIS — N926 Irregular menstruation, unspecified: Secondary | ICD-10-CM | POA: Diagnosis not present

## 2016-08-08 DIAGNOSIS — Z3201 Encounter for pregnancy test, result positive: Secondary | ICD-10-CM | POA: Diagnosis not present

## 2016-08-08 LAB — POCT URINE PREGNANCY: Preg Test, Ur: POSITIVE — AB

## 2016-08-08 NOTE — Patient Instructions (Signed)
First Trimester of Pregnancy The first trimester of pregnancy is from week 1 until the end of week 12 (months 1 through 3). A week after a sperm fertilizes an egg, the egg will implant on the wall of the uterus. This embryo will begin to develop into a baby. Genes from you and your partner are forming the baby. The female genes determine whether the baby is a boy or a girl. At 6-8 weeks, the eyes and face are formed, and the heartbeat can be seen on ultrasound. At the end of 12 weeks, all the baby's organs are formed.  Now that you are pregnant, you will want to do everything you can to have a healthy baby. Two of the most important things are to get good prenatal care and to follow your health care provider's instructions. Prenatal care is all the medical care you receive before the baby's birth. This care will help prevent, find, and treat any problems during the pregnancy and childbirth. BODY CHANGES Your body goes through many changes during pregnancy. The changes vary from woman to woman.   You may gain or lose a couple of pounds at first.  You may feel sick to your stomach (nauseous) and throw up (vomit). If the vomiting is uncontrollable, call your health care provider.  You may tire easily.  You may develop headaches that can be relieved by medicines approved by your health care provider.  You may urinate more often. Painful urination may mean you have a bladder infection.  You may develop heartburn as a result of your pregnancy.  You may develop constipation because certain hormones are causing the muscles that push waste through your intestines to slow down.  You may develop hemorrhoids or swollen, bulging veins (varicose veins).  Your breasts may begin to grow larger and become tender. Your nipples may stick out more, and the tissue that surrounds them (areola) may become darker.  Your gums may bleed and may be sensitive to brushing and flossing.  Dark spots or blotches (chloasma,  mask of pregnancy) may develop on your face. This will likely fade after the baby is born.  Your menstrual periods will stop.  You may have a loss of appetite.  You may develop cravings for certain kinds of food.  You may have changes in your emotions from day to day, such as being excited to be pregnant or being concerned that something may go wrong with the pregnancy and baby.  You may have more vivid and strange dreams.  You may have changes in your hair. These can include thickening of your hair, rapid growth, and changes in texture. Some women also have hair loss during or after pregnancy, or hair that feels dry or thin. Your hair will most likely return to normal after your baby is born. WHAT TO EXPECT AT YOUR PRENATAL VISITS During a routine prenatal visit:  You will be weighed to make sure you and the baby are growing normally.  Your blood pressure will be taken.  Your abdomen will be measured to track your baby's growth.  The fetal heartbeat will be listened to starting around week 10 or 12 of your pregnancy.  Test results from any previous visits will be discussed. Your health care provider may ask you:  How you are feeling.  If you are feeling the baby move.  If you have had any abnormal symptoms, such as leaking fluid, bleeding, severe headaches, or abdominal cramping.  If you are using any tobacco products,   including cigarettes, chewing tobacco, and electronic cigarettes.  If you have any questions. Other tests that may be performed during your first trimester include:  Blood tests to find your blood type and to check for the presence of any previous infections. They will also be used to check for low iron levels (anemia) and Rh antibodies. Later in the pregnancy, blood tests for diabetes will be done along with other tests if problems develop.  Urine tests to check for infections, diabetes, or protein in the urine.  An ultrasound to confirm the proper growth  and development of the baby.  An amniocentesis to check for possible genetic problems.  Fetal screens for spina bifida and Down syndrome.  You may need other tests to make sure you and the baby are doing well.  HIV (human immunodeficiency virus) testing. Routine prenatal testing includes screening for HIV, unless you choose not to have this test. HOME CARE INSTRUCTIONS  Medicines  Follow your health care provider's instructions regarding medicine use. Specific medicines may be either safe or unsafe to take during pregnancy.  Take your prenatal vitamins as directed.  If you develop constipation, try taking a stool softener if your health care provider approves. Diet  Eat regular, well-balanced meals. Choose a variety of foods, such as meat or vegetable-based protein, fish, milk and low-fat dairy products, vegetables, fruits, and whole grain breads and cereals. Your health care provider will help you determine the amount of weight gain that is right for you.  Avoid raw meat and uncooked cheese. These carry germs that can cause birth defects in the baby.  Eating four or five small meals rather than three large meals a day may help relieve nausea and vomiting. If you start to feel nauseous, eating a few soda crackers can be helpful. Drinking liquids between meals instead of during meals also seems to help nausea and vomiting.  If you develop constipation, eat more high-fiber foods, such as fresh vegetables or fruit and whole grains. Drink enough fluids to keep your urine clear or pale yellow. Activity and Exercise  Exercise only as directed by your health care provider. Exercising will help you:  Control your weight.  Stay in shape.  Be prepared for labor and delivery.  Experiencing pain or cramping in the lower abdomen or low back is a good sign that you should stop exercising. Check with your health care provider before continuing normal exercises.  Try to avoid standing for long  periods of time. Move your legs often if you must stand in one place for a long time.  Avoid heavy lifting.  Wear low-heeled shoes, and practice good posture.  You may continue to have sex unless your health care provider directs you otherwise. Relief of Pain or Discomfort  Wear a good support bra for breast tenderness.   Take warm sitz baths to soothe any pain or discomfort caused by hemorrhoids. Use hemorrhoid cream if your health care provider approves.   Rest with your legs elevated if you have leg cramps or low back pain.  If you develop varicose veins in your legs, wear support hose. Elevate your feet for 15 minutes, 3-4 times a day. Limit salt in your diet. Prenatal Care  Schedule your prenatal visits by the twelfth week of pregnancy. They are usually scheduled monthly at first, then more often in the last 2 months before delivery.  Write down your questions. Take them to your prenatal visits.  Keep all your prenatal visits as directed by your   health care provider. Safety  Wear your seat belt at all times when driving.  Make a list of emergency phone numbers, including numbers for family, friends, the hospital, and police and fire departments. General Tips  Ask your health care provider for a referral to a local prenatal education class. Begin classes no later than at the beginning of month 6 of your pregnancy.  Ask for help if you have counseling or nutritional needs during pregnancy. Your health care provider can offer advice or refer you to specialists for help with various needs.  Do not use hot tubs, steam rooms, or saunas.  Do not douche or use tampons or scented sanitary pads.  Do not cross your legs for long periods of time.  Avoid cat litter boxes and soil used by cats. These carry germs that can cause birth defects in the baby and possibly loss of the fetus by miscarriage or stillbirth.  Avoid all smoking, herbs, alcohol, and medicines not prescribed by  your health care provider. Chemicals in these affect the formation and growth of the baby.  Do not use any tobacco products, including cigarettes, chewing tobacco, and electronic cigarettes. If you need help quitting, ask your health care provider. You may receive counseling support and other resources to help you quit.  Schedule a dentist appointment. At home, brush your teeth with a soft toothbrush and be gentle when you floss. SEEK MEDICAL CARE IF:   You have dizziness.  You have mild pelvic cramps, pelvic pressure, or nagging pain in the abdominal area.  You have persistent nausea, vomiting, or diarrhea.  You have a bad smelling vaginal discharge.  You have pain with urination.  You notice increased swelling in your face, hands, legs, or ankles. SEEK IMMEDIATE MEDICAL CARE IF:   You have a fever.  You are leaking fluid from your vagina.  You have spotting or bleeding from your vagina.  You have severe abdominal cramping or pain.  You have rapid weight gain or loss.  You vomit blood or material that looks like coffee grounds.  You are exposed to German measles and have never had them.  You are exposed to fifth disease or chickenpox.  You develop a severe headache.  You have shortness of breath.  You have any kind of trauma, such as from a fall or a car accident.   This information is not intended to replace advice given to you by your health care provider. Make sure you discuss any questions you have with your health care provider.   Document Released: 10/22/2001 Document Revised: 11/18/2014 Document Reviewed: 09/07/2013 Elsevier Interactive Patient Education 2016 Elsevier Inc.  

## 2016-08-08 NOTE — Progress Notes (Signed)
   Subjective:    Patient ID: Anna CommonsJudith Stephani, female    DOB: Jun 28, 1986, 30 y.o.   MRN: [id number]  HPI This is a 30 yo female, accompanied by her husband and son, who presents today for pregnancy confirmation test. She has taken a pregnancy test at home and it was positive. Patient's last menstrual period was 07/09/2016. She is concerned because she has taken some prescription medications (OCPs- irregularly, flonase) and some otc medications before realizing she was pregnant. She has also been riding a four wheeler, lifting weights and running on a treadmill. She has had some mild night time nausea. No vomiting, no  bleeding/spotting. She is not currently taking any medication.   Past Medical History:  Diagnosis Date  . Anxiety    No past surgical history on file. Family History  Problem Relation Age of Onset  . Other Neg Hx    Social History  Substance Use Topics  . Smoking status: Never Smoker  . Smokeless tobacco: Never Used  . Alcohol use 0.0 oz/week     Comment: occasional      Review of Systems Per HPI    Objective:   Physical Exam Physical Exam  Vitals reviewed. Constitutional: Oriented to person, place, and time. Appears well-developed and well-nourished.  HENT:  Head: Normocephalic and atraumatic.  Eyes: Conjunctivae are normal.  Neck: Normal range of motion. Neck supple.  Cardiovascular: Normal rate.   Pulmonary/Chest: Effort normal.  Musculoskeletal: Normal range of motion.  Neurological: Alert and oriented to person, place, and time.  Skin: Skin is warm and dry.  Psychiatric: Normal mood and affect. Behavior is normal. Judgment and thought content normal.         BP 110/88   Pulse 88   Wt 119 lb (54 kg)   LMP 07/09/2016   SpO2 98%   BMI 20.11 kg/m  Wt Readings from Last 3 Encounters:  08/08/16 119 lb (54 kg)  07/02/16 119 lb (54 kg)  04/04/16 120 lb (54.4 kg)   Results for orders placed or performed in visit on 08/08/16  POCT urine pregnancy    Result Value Ref Range   Preg Test, Ur Positive (A) Negative    Assessment & Plan:  1. Missed period - POCT urine pregnancy  2. Positive urine pregnancy test - provided written and verbal information regarding first trimester - instructed her to start daily prenatal vitamin - she will schedule with OB   Olean Reeeborah Dayshon Roback, FNP-BC  [facility], [facility]  08/08/2016 3:52 PM

## 2016-08-15 ENCOUNTER — Telehealth: Payer: Self-pay | Admitting: *Deleted

## 2016-08-15 ENCOUNTER — Encounter: Payer: Self-pay | Admitting: *Deleted

## 2016-08-15 NOTE — Telephone Encounter (Signed)
Pt called in stating she went to the bathroom and noticed faint, pink spotting. She also c/o lower abd left and right side intermittant cramping/pain. LMP 07/09/16 which makes her about [redacted]wks pregnant. She denies bright red heavy bleeding. Explained to that cervix is sensitive during pregnancy and intercourse may cause cause some spotting and also as the uterus grows the round ligaments stretch and can cause some lower abd discomfort. Pt to report to [facility] if bleeding/pain increases. Pt expressed understanding.

## 2016-08-21 ENCOUNTER — Encounter (HOSPITAL_COMMUNITY): Payer: Self-pay

## 2016-08-21 ENCOUNTER — Telehealth: Payer: Self-pay | Admitting: *Deleted

## 2016-08-21 ENCOUNTER — Inpatient Hospital Stay (HOSPITAL_COMMUNITY): Payer: 59

## 2016-08-21 ENCOUNTER — Inpatient Hospital Stay (HOSPITAL_COMMUNITY)
Admission: AD | Admit: 2016-08-21 | Discharge: 2016-08-21 | Disposition: A | Discharge status: Home / Self Care | Payer: 59 | Source: Ambulatory Visit | Attending: Family Medicine | Admitting: Family Medicine

## 2016-08-21 DIAGNOSIS — O30041 Twin pregnancy, dichorionic/diamniotic, first trimester: Secondary | ICD-10-CM | POA: Insufficient documentation

## 2016-08-21 DIAGNOSIS — O200 Threatened abortion: Secondary | ICD-10-CM | POA: Insufficient documentation

## 2016-08-21 DIAGNOSIS — O209 Hemorrhage in early pregnancy, unspecified: Secondary | ICD-10-CM

## 2016-08-21 DIAGNOSIS — O468X1 Other antepartum hemorrhage, first trimester: Secondary | ICD-10-CM

## 2016-08-21 DIAGNOSIS — Z3A01 Less than 8 weeks gestation of pregnancy: Secondary | ICD-10-CM | POA: Insufficient documentation

## 2016-08-21 DIAGNOSIS — O418X10 Other specified disorders of amniotic fluid and membranes, first trimester, not applicable or unspecified: Secondary | ICD-10-CM

## 2016-08-21 DIAGNOSIS — R109 Unspecified abdominal pain: Secondary | ICD-10-CM | POA: Diagnosis present

## 2016-08-21 LAB — URINALYSIS, ROUTINE W REFLEX MICROSCOPIC
Bilirubin Urine: NEGATIVE
Glucose, UA: NEGATIVE mg/dL
Ketones, ur: NEGATIVE mg/dL
Leukocytes, UA: NEGATIVE
Nitrite: NEGATIVE
Protein, ur: NEGATIVE mg/dL
Specific Gravity, Urine: 1.01 (ref 1.005–1.030)
pH: 6.5 (ref 5.0–8.0)

## 2016-08-21 LAB — CBC
HCT: 36.8 % (ref 36.0–46.0)
Hemoglobin: 13 g/dL (ref 12.0–15.0)
MCH: 30.2 pg (ref 26.0–34.0)
MCHC: 35.3 g/dL (ref 30.0–36.0)
MCV: 85.6 fL (ref 78.0–100.0)
Platelets: 226 10*3/uL (ref 150–400)
RBC: 4.3 MIL/uL (ref 3.87–5.11)
RDW: 12.2 % (ref 11.5–15.5)
WBC: 7.8 10*3/uL (ref 4.0–10.5)

## 2016-08-21 LAB — WET PREP, GENITAL
Clue Cells Wet Prep HPF POC: NONE SEEN
Sperm: NONE SEEN
Trich, Wet Prep: NONE SEEN
Yeast Wet Prep HPF POC: NONE SEEN

## 2016-08-21 LAB — URINE MICROSCOPIC-ADD ON

## 2016-08-21 LAB — ABO/RH: ABO/RH(D): O POS

## 2016-08-21 LAB — HCG, QUANTITATIVE, PREGNANCY: hCG, Beta Chain, Quant, S: 32318 m[IU]/mL — ABNORMAL HIGH (ref <5)

## 2016-08-21 NOTE — Discharge Instructions (Signed)
Subchorionic Hematoma A subchorionic hematoma is a gathering of blood between the outer wall of the placenta and the inner wall of the womb (uterus). The placenta is the organ that connects the fetus to the wall of the uterus. The placenta performs the feeding, breathing (oxygen to the fetus), and waste removal (excretory work) of the fetus.  Subchorionic hematoma is the most common abnormality found on a result from ultrasonography done during the first trimester or early second trimester of pregnancy. If there has been little or no vaginal bleeding, early small hematomas usually shrink on their own and do not affect your baby or pregnancy. The blood is gradually absorbed over 1-2 weeks. When bleeding starts later in pregnancy or the hematoma is larger or occurs in an older pregnant woman, the outcome may not be as good. Larger hematomas may get bigger, which increases the chances for miscarriage. Subchorionic hematoma also increases the risk of premature detachment of the placenta from the uterus, preterm (premature) labor, and stillbirth. HOME CARE INSTRUCTIONS  Stay on bed rest if your health care provider recommends this. Although bed rest will not prevent more bleeding or prevent a miscarriage, your health care provider may recommend bed rest until you are advised otherwise.  Avoid heavy lifting (more than 10 lb [4.5 kg]), exercise, sexual intercourse, or douching as directed by your health care provider.  Keep track of the number of pads you use each day and how soaked (saturated) they are. Write down this information.  Do not use tampons.  Keep all follow-up appointments as directed by your health care provider. Your health care provider may ask you to have follow-up blood tests or ultrasound tests or both. SEEK IMMEDIATE MEDICAL CARE IF:  You have severe cramps in your stomach, back, abdomen, or pelvis.  You have a fever.  You pass large clots or tissue. Save any tissue for your health  care provider to look at.  Your bleeding increases or you become lightheaded, feel weak, or have fainting episodes.   This information is not intended to replace advice given to you by your health care provider. Make sure you discuss any questions you have with your health care provider.   Document Released: 02/12/2007 Document Revised: 11/18/2014 Document Reviewed: 05/27/2013 Elsevier Interactive Patient Education 2016 Elsevier Inc.    Pelvic Rest Pelvic rest is sometimes recommended for women when:   The placenta is partially or completely covering the opening of the cervix (placenta previa).  There is bleeding between the uterine wall and the amniotic sac in the first trimester (subchorionic hemorrhage).  The cervix begins to open without labor starting (incompetent cervix, cervical insufficiency).  The labor is too early (preterm labor). HOME CARE INSTRUCTIONS  Do not have sexual intercourse, stimulation, or an orgasm.  Do not use tampons, douche, or put anything in the vagina.  Do not lift anything over 10 pounds (4.5 kg).  Avoid strenuous activity or straining your pelvic muscles. SEEK MEDICAL CARE IF:  You have any vaginal bleeding during pregnancy. Treat this as a potential emergency.  You have cramping pain felt low in the stomach (stronger than menstrual cramps).  You notice vaginal discharge (watery, mucus, or bloody).  You have a low, dull backache.  There are regular contractions or uterine tightening. SEEK IMMEDIATE MEDICAL CARE IF: You have vaginal bleeding and have placenta previa.    This information is not intended to replace advice given to you by your health care provider. Make sure you discuss any questions  you have with your health care provider.   Document Released: 02/22/2011 Document Revised: 01/20/2012 Document Reviewed: 05/01/2015 Elsevier Interactive Patient Education 2016 [employer]   Threatened Miscarriage A threatened miscarriage  occurs when you have vaginal bleeding during your first 20 weeks of pregnancy but the pregnancy has not ended. If you have vaginal bleeding during this time, your health care provider will do tests to make sure you are still pregnant. If the tests show you are still pregnant and the developing baby (fetus) inside your womb (uterus) is still growing, your condition is considered a threatened miscarriage. A threatened miscarriage does not mean your pregnancy will end, but it does increase the risk of losing your pregnancy (complete miscarriage). CAUSES  The cause of a threatened miscarriage is usually not known. If you go on to have a complete miscarriage, the most common cause is an abnormal number of chromosomes in the developing baby. Chromosomes are the structures inside cells that hold all your genetic material. Some causes of vaginal bleeding that do not result in miscarriage include:  Having sex.  Having an infection.  Normal hormone changes of pregnancy.  Bleeding that occurs when an egg implants in your uterus. RISK FACTORS Risk factors for bleeding in early pregnancy include:  Obesity.  Smoking.  Drinking excessive amounts of alcohol or caffeine.  Recreational drug use. SIGNS AND SYMPTOMS  Light vaginal bleeding.  Mild abdominal pain or cramps. DIAGNOSIS  If you have bleeding with or without abdominal pain before 20 weeks of pregnancy, your health care provider will do tests to check whether you are still pregnant. One important test involves using sound waves and a computer (ultrasound) to create images of the inside of your uterus. Other tests include an internal exam of your vagina and uterus (pelvic exam) and measurement of your baby's heart rate.  You may be diagnosed with a threatened miscarriage if:  Ultrasound testing shows you are still pregnant.  Your baby's heart rate is strong.  A pelvic exam shows that the opening between your uterus and your vagina (cervix)  is closed.  Your heart rate and blood pressure are stable.  Blood tests confirm you are still pregnant. TREATMENT  No treatments have been shown to prevent a threatened miscarriage from going on to a complete miscarriage. However, the right home care is important.  HOME CARE INSTRUCTIONS   Make sure you keep all your appointments for prenatal care. This is very important.  Get plenty of rest.  Do not have sex or use tampons if you have vaginal bleeding.  Do not douche.  Do not smoke or use recreational drugs.  Do not drink alcohol.  Avoid caffeine. SEEK MEDICAL CARE IF:  You have light vaginal bleeding or spotting while pregnant.  You have abdominal pain or cramping.  You have a fever. SEEK IMMEDIATE MEDICAL CARE IF:  You have heavy vaginal bleeding.  You have blood clots coming from your vagina.  You have severe low back pain or abdominal cramps.  You have fever, chills, and severe abdominal pain. MAKE SURE YOU:  Understand these instructions.  Will watch your condition.  Will get help right away if you are not doing well or get worse.   This information is not intended to replace advice given to you by your health care provider. Make sure you discuss any questions you have with your health care provider.   Document Released: 10/28/2005 Document Revised: 11/02/2013 Document Reviewed: 08/24/2013 Elsevier Interactive Patient Education [employer] Elsevier Inc.

## 2016-08-21 NOTE — Telephone Encounter (Signed)
Pt is early pregnancy, called the office stating she had a large amount of bright red bleeding and is still experiencing it at this time.  Informed pt that it could be a threatened miscarriage and I could schedule her an appt for in the morning or she could go to [facility] for evaluation.  Pt was very tearful and preferred to go straight to [facility] for evaluation.

## 2016-08-21 NOTE — [facility] Note (Addendum)
Thinks she is having a miscarriage. All of the sudden a bunch of blood is gushing out and is having  Pain in lower abd.  Started at [employer]1130

## 2016-08-21 NOTE — MAU Provider Note (Signed)
History     CSN: [id number]  Arrival date and time: 08/21/16 1201   First Provider Initiated Contact with Patient 08/21/16 1240      Chief Complaint  Patient presents with  . Abdominal Pain  . Vaginal Bleeding   HPI Anna [name] is a 30 y.o. G2P1001 at [redacted]w[redacted]d by LMP who presents with vaginal bleeding & abdominal cramping. Was at the store this afternoon when she felt a gush of blood. States the blood has continued since then. Has not noticed clots. Has not saturated points. Intermittent lower abdominal sharp pain that began soon after bleeding started. Rates pain 4/10. Has not treated. Movement makes pain worse. Pain improved when in a curled up position. Denies n/v/d, constipation, dysuria, or fever. Last had intercourse 2 days ago.   OB History    Gravida Para Term Preterm AB Living   2 1 1  0 0 1   SAB TAB Ectopic Multiple Live Births   0 0 0 0 1      Past Medical History:  Diagnosis Date  . Anxiety     History reviewed. No pertinent surgical history.  Family History  Problem Relation Age of Onset  . Other Neg Hx     Social History  Substance Use Topics  . Smoking status: Never Smoker  . Smokeless tobacco: Never Used  . Alcohol use 0.0 oz/week     Comment: occasional    Allergies: No Known Allergies  No prescriptions prior to admission.    Review of Systems  Constitutional: Negative.   Gastrointestinal: Positive for abdominal pain. Negative for constipation, diarrhea, nausea and vomiting.  Genitourinary: Negative for dysuria.       + vaginal bleeding   Physical Exam   Blood pressure 132/98, pulse 77, temperature 98.2 F (36.8 C), temperature source Oral, resp. rate 16, weight 120 lb 8 oz (54.7 kg), last menstrual period 07/09/2016.  Physical Exam  Nursing note and vitals reviewed. Constitutional: She is oriented to person, place, and time. She appears well-developed and well-nourished. No distress.  HENT:  Head: Normocephalic and atraumatic.  Eyes:  Conjunctivae are normal. Right eye exhibits no discharge. Left eye exhibits no discharge. No scleral icterus.  Neck: Normal range of motion.  Respiratory: Effort normal. No respiratory distress.  GI: Soft. She exhibits no distension. There is no tenderness. There is no rebound.  Genitourinary: Uterus normal. Cervix exhibits no motion tenderness and no friability. There is bleeding (small amount of dark red blood cleared out with 1 fox swab) in the vagina.  Genitourinary Comments: Cervix closed  Neurological: She is alert and oriented to person, place, and time.  Skin: Skin is warm and dry. She is not diaphoretic.  Psychiatric: She has a normal mood and affect. Her behavior is normal. Judgment and thought content normal.    MAU Course  Procedures Results for orders placed or performed during the hospital encounter of 08/21/16 (from the past 24 hour(s))  Urinalysis, Routine w reflex microscopic (not at [facility])     Status: Abnormal   Collection Time: 08/21/16 12:01 PM  Result Value Ref Range   Color, Urine YELLOW YELLOW   APPearance CLEAR CLEAR   Specific Gravity, Urine 1.010 1.005 - 1.030   pH 6.5 5.0 - 8.0   Glucose, UA NEGATIVE NEGATIVE mg/dL   Hgb urine dipstick LARGE (A) NEGATIVE   Bilirubin Urine NEGATIVE NEGATIVE   Ketones, ur NEGATIVE NEGATIVE mg/dL   Protein, ur NEGATIVE NEGATIVE mg/dL   Nitrite NEGATIVE NEGATIVE  Leukocytes, UA NEGATIVE NEGATIVE  Urine microscopic-add on     Status: Abnormal   Collection Time: 08/21/16 12:01 PM  Result Value Ref Range   Squamous Epithelial / LPF 0-5 (A) NONE SEEN   WBC, UA 0-5 0 - 5 WBC/hpf   RBC / HPF TOO NUMEROUS TO COUNT 0 - 5 RBC/hpf   Bacteria, UA RARE (A) NONE SEEN  CBC     Status: None   Collection Time: 08/21/16 12:46 PM  Result Value Ref Range   WBC 7.8 4.0 - 10.5 K/uL   RBC 4.30 3.87 - 5.11 MIL/uL   Hemoglobin 13.0 12.0 - 15.0 g/dL   HCT 16.136.8 09.636.0 - 04.546.0 %   MCV 85.6 78.0 - 100.0 fL   MCH 30.2 26.0 - 34.0 pg   MCHC 35.3  30.0 - 36.0 g/dL   RDW 40.912.2 81.111.5 - 91.415.5 %   Platelets 226 150 - 400 K/uL  ABO/Rh     Status: None (Preliminary result)   Collection Time: 08/21/16 12:46 PM  Result Value Ref Range   ABO/RH(D) O POS   hCG, quantitative, pregnancy     Status: Abnormal   Collection Time: 08/21/16 12:46 PM  Result Value Ref Range   hCG, Beta Chain, Quant, S 32,318 (H) <5 mIU/mL  Wet prep, genital     Status: Abnormal   Collection Time: 08/21/16 12:47 PM  Result Value Ref Range   Yeast Wet Prep HPF POC NONE SEEN NONE SEEN   Trich, Wet Prep NONE SEEN NONE SEEN   Clue Cells Wet Prep HPF POC NONE SEEN NONE SEEN   WBC, Wet Prep HPF POC FEW (A) NONE SEEN   Sperm NONE SEEN    [place] Ob Comp Less 14 Wks  Result Date: 08/21/2016 CLINICAL DATA:  Vaginal bleeding and cramping starting earlier today. Positive pregnancy test. EXAM: TWIN OBSTETRIC <14WK US AND TRANSVAGINAL OB US COMPARISON:  None. FINDINGS: Number of IUPs:  2 Chorionicity/Amnionicity:  Dichorionic/diamniotic TWIN 1 Yolk sac:  Visualized. Embryo:  Visualized. Cardiac Activity: Visualized. Heart Rate: 80 bpm CRL:  2.5  mm   5 w 5 d                  US EDC: 04/18/2017 TWIN 2 Yolk sac:  Visualized. Embryo:  Visualized. Cardiac Activity: Not visualized. Heart Rate: N/A bpm CRL:  3.3  mm   5 w 6 d                  US EDC: 04/17/2017. Subchorionic hemorrhage:  Small to moderate Maternal uterus/adnexae: 14 mm intramural uterine fibroid. Probable corpus luteum cyst right ovary. Left ovary unremarkable. Small volume intraperitoneal free fluid noted in the cul-de-sac. IMPRESSION: Twin intrauterine gestation identified with embryonic heart activity seen in twin 1 , but not twin 2. Follow-up ultrasound in 7 days could be performed to assess for appropriate progression of pregnancy. Electronically Signed   By: Kennith CenterEric  Mansell M.D.   On: 08/21/2016 14:25   [place] Ob Comp Addl Gest Less 14 Wks  Result Date: 08/21/2016 CLINICAL DATA:  Vaginal bleeding and cramping starting earlier  today. Positive pregnancy test. EXAM: TWIN OBSTETRIC <14WK US AND TRANSVAGINAL OB US COMPARISON:  None. FINDINGS: Number of IUPs:  2 Chorionicity/Amnionicity:  Dichorionic/diamniotic TWIN 1 Yolk sac:  Visualized. Embryo:  Visualized. Cardiac Activity: Visualized. Heart Rate: 80 bpm CRL:  2.5  mm   5 w 5 d  [place] EDC: 04/18/2017 TWIN 2 Yolk sac:  Visualized. Embryo:  Visualized. Cardiac Activity: Not visualized. Heart Rate: N/A bpm CRL:  3.3  mm   5 w 6 d                  [place] EDC: 04/17/2017. Subchorionic hemorrhage:  Small to moderate Maternal uterus/adnexae: 14 mm intramural uterine fibroid. Probable corpus luteum cyst right ovary. Left ovary unremarkable. Small volume intraperitoneal free fluid noted in the cul-de-sac. IMPRESSION: Twin intrauterine gestation identified with embryonic heart activity seen in twin 1 , but not twin 2. Follow-up ultrasound in 7 days could be performed to assess for appropriate progression of pregnancy. Electronically Signed   By: Kennith Center M.D.   On: 08/21/2016 14:25   US Ob Transvaginal  Result Date: 08/21/2016 CLINICAL DATA:  Vaginal bleeding and cramping starting earlier today. Positive pregnancy test. EXAM: TWIN OBSTETRIC <14WK [place] AND TRANSVAGINAL OB US COMPARISON:  None. FINDINGS: Number of IUPs:  2 Chorionicity/Amnionicity:  Dichorionic/diamniotic TWIN 1 Yolk sac:  Visualized. Embryo:  Visualized. Cardiac Activity: Visualized. Heart Rate: 80 bpm CRL:  2.5  mm   5 w 5 d                  [place] EDC: 04/18/2017 TWIN 2 Yolk sac:  Visualized. Embryo:  Visualized. Cardiac Activity: Not visualized. Heart Rate: N/A bpm CRL:  3.3  mm   5 w 6 d                  [place] EDC: 04/17/2017. Subchorionic hemorrhage:  Small to moderate Maternal uterus/adnexae: 14 mm intramural uterine fibroid. Probable corpus luteum cyst right ovary. Left ovary unremarkable. Small volume intraperitoneal free fluid noted in the cul-de-sac. IMPRESSION: Twin intrauterine gestation identified with  embryonic heart activity seen in twin 1 , but not twin 2. Follow-up ultrasound in 7 days could be performed to assess for appropriate progression of pregnancy. Electronically Signed   By: Kennith Center M.D.   On: 08/21/2016 14:25     MDM +UPT UA, wet prep, GC/chlamydia, CBC, ABO/Rh, quant hCG, HIV, and [place] today to rule out ectopic pregnancy O positive Ultrasound shows di/di twin IUP. A cardiac activit 80 bm & B no cardiac activity. Small SCH.  Discussed results with patient -- will order outpatient f/u ultrasound in 1 weeks as per radiologist recommendation  Assessment and Plan  A: 1. Threatened miscarriage   2. Vaginal bleeding in pregnancy, first trimester   3. Subchorionic hematoma in first trimester, single or unspecified fetus   4. Dichorionic diamniotic twin pregnancy in first trimester    P: Discharge home Pelvic rest Outpatient ultrasound in 7 days Discussed reasons to return to MAU Msg to [facility] for f/u appt to discuss results  Judeth Horn 08/21/2016, 12:40 PM

## 2016-08-22 LAB — HIV ANTIBODY (ROUTINE TESTING W REFLEX): HIV Screen 4th Generation wRfx: NONREACTIVE

## 2016-08-22 LAB — GC/CHLAMYDIA PROBE AMP (~~LOC~~) NOT AT ARMC
Chlamydia: NEGATIVE
Neisseria Gonorrhea: NEGATIVE

## 2016-08-28 ENCOUNTER — Ambulatory Visit (HOSPITAL_COMMUNITY)
Admission: RE | Admit: 2016-08-28 | Discharge: 2016-08-28 | Disposition: A | Discharge status: Home / Self Care | Payer: 59 | Source: Ambulatory Visit | Attending: Student | Admitting: Student

## 2016-08-28 ENCOUNTER — Ambulatory Visit: Payer: 59 | Admitting: *Deleted

## 2016-08-28 DIAGNOSIS — O30041 Twin pregnancy, dichorionic/diamniotic, first trimester: Secondary | ICD-10-CM

## 2016-08-28 DIAGNOSIS — Z3A01 Less than 8 weeks gestation of pregnancy: Secondary | ICD-10-CM | POA: Insufficient documentation

## 2016-08-28 DIAGNOSIS — O418X10 Other specified disorders of amniotic fluid and membranes, first trimester, not applicable or unspecified: Secondary | ICD-10-CM

## 2016-08-28 DIAGNOSIS — O209 Hemorrhage in early pregnancy, unspecified: Secondary | ICD-10-CM

## 2016-08-28 DIAGNOSIS — Z712 Person consulting for explanation of examination or test findings: Secondary | ICD-10-CM

## 2016-08-28 DIAGNOSIS — O200 Threatened abortion: Secondary | ICD-10-CM

## 2016-08-28 DIAGNOSIS — O208 Other hemorrhage in early pregnancy: Secondary | ICD-10-CM | POA: Diagnosis not present

## 2016-08-28 DIAGNOSIS — O468X1 Other antepartum hemorrhage, first trimester: Secondary | ICD-10-CM

## 2016-08-28 NOTE — Progress Notes (Signed)
Consult with Dr. Erin FullingHarraway-Smith regarding today's ultrasound results. She advised pt needs office ultrasound in 2 weeks to assess viability, then New Ob appt in 3-4 weeks. Pt was advised of this recommendation as well as today's results showing FHT's present for both babies however somewhat low. There is also still a clot in the uterus. For this reason pt should refrain from sex and any strenuous activity or exercise. She may work provided she does not perform heavy lifting of objects. Pt confirms understanding of all information and instructions given. She will receive a call from the [facility] [place] [place] office with her appt information - message sent to Marda StalkerKristen Lassiter, Charity fundraiserN.

## 2016-09-10 ENCOUNTER — Encounter: Payer: Self-pay | Admitting: Obstetrics & Gynecology

## 2016-09-10 ENCOUNTER — Ambulatory Visit (INDEPENDENT_AMBULATORY_CARE_PROVIDER_SITE_OTHER): Payer: 59 | Admitting: Obstetrics & Gynecology

## 2016-09-10 VITALS — BP 124/82 | HR 76 | Wt 119.0 lb

## 2016-09-10 DIAGNOSIS — O0991 Supervision of high risk pregnancy, unspecified, first trimester: Secondary | ICD-10-CM

## 2016-09-10 DIAGNOSIS — O418X10 Other specified disorders of amniotic fluid and membranes, first trimester, not applicable or unspecified: Secondary | ICD-10-CM | POA: Diagnosis not present

## 2016-09-10 DIAGNOSIS — O3121X1 Continuing pregnancy after intrauterine death of one fetus or more, first trimester, fetus 1: Secondary | ICD-10-CM | POA: Insufficient documentation

## 2016-09-10 DIAGNOSIS — Z3689 Encounter for other specified antenatal screening: Secondary | ICD-10-CM | POA: Diagnosis not present

## 2016-09-10 DIAGNOSIS — Z3490 Encounter for supervision of normal pregnancy, unspecified, unspecified trimester: Secondary | ICD-10-CM | POA: Insufficient documentation

## 2016-09-10 DIAGNOSIS — O30041 Twin pregnancy, dichorionic/diamniotic, first trimester: Secondary | ICD-10-CM

## 2016-09-10 DIAGNOSIS — O30049 Twin pregnancy, dichorionic/diamniotic, unspecified trimester: Secondary | ICD-10-CM | POA: Diagnosis not present

## 2016-09-10 DIAGNOSIS — O418X12 Other specified disorders of amniotic fluid and membranes, first trimester, fetus 2: Secondary | ICD-10-CM

## 2016-09-10 DIAGNOSIS — O468X1 Other antepartum hemorrhage, first trimester: Secondary | ICD-10-CM

## 2016-09-10 NOTE — Patient Instructions (Signed)
Return to clinic for any scheduled appointments or obstetric concerns, or go to MAU for evaluation   Thank you for enrolling in MyChart. Please follow the instructions below to securely access your online medical record. MyChart allows you to send messages to your doctor, view your test results, manage appointments, and more.   How Do I Sign Up? 1. In your Internet browser, go to [employer] and enter [email or web address]. 2. Click on the Sign Up Now link in the Sign In box. You will see the New Member Sign Up page. 3. Enter your MyChart Access Code exactly as it appears below. You will not need to use this code after you've completed the sign-up process. If you do not sign up before the expiration date, you must request a new code.  MyChart Access Code: [email or web address] Expires: 10/07/2016  3:42 PM  4. Enter your Social Security Number ([id number]) and Date of Birth (mm/dd/yyyy) as indicated and click Submit. You will be taken to the next sign-up page. 5. Create a MyChart ID. This will be your MyChart login ID and cannot be changed, so think of one that is secure and easy to remember. 6. Create a MyChart password. You can change your password at any time. 7. Enter your Password Reset Question and Answer. This can be used at a later time if you forget your password.  8. Enter your e-mail address. You will receive e-mail notification when new information is available in MyChart. 9. Click Sign Up. You can now view your medical record.   Additional Information Remember, MyChart is NOT to be used for urgent needs. For medical emergencies, dial 911.    First Trimester of Pregnancy The first trimester of pregnancy is from week 1 until the end of week 12 (months 1 through 3). A week after a sperm fertilizes an egg, the egg will implant on the wall of the uterus. This embryo will begin to develop into a baby. Genes from you and your partner are forming the baby. The female genes  determine whether the baby is a boy or a girl. At 6-8 weeks, the eyes and face are formed, and the heartbeat can be seen on ultrasound. At the end of 12 weeks, all the baby's organs are formed.  Now that you are pregnant, you will want to do everything you can to have a healthy baby. Two of the most important things are to get good prenatal care and to follow your health care provider's instructions. Prenatal care is all the medical care you receive before the baby's birth. This care will help prevent, find, and treat any problems during the pregnancy and childbirth. BODY CHANGES Your body goes through many changes during pregnancy. The changes vary from woman to woman.   You may gain or lose a couple of pounds at first.  You may feel sick to your stomach (nauseous) and throw up (vomit). If the vomiting is uncontrollable, call your health care provider.  You may tire easily.  You may develop headaches that can be relieved by medicines approved by your health care provider.  You may urinate more often. Painful urination may mean you have a bladder infection.  You may develop heartburn as a result of your pregnancy.  You may develop constipation because certain hormones are causing the muscles that push waste through your intestines to slow down.  You may develop hemorrhoids or swollen, bulging veins (varicose veins).  Your breasts may begin to grow larger and become tender. Your  nipples may stick out more, and the tissue that surrounds them (areola) may become darker.  Your gums may bleed and may be sensitive to brushing and flossing.  Dark spots or blotches (chloasma, mask of pregnancy) may develop on your face. This will likely fade after the baby is born.  Your menstrual periods will stop.  You may have a loss of appetite.  You may develop cravings for certain kinds of food.  You may have changes in your emotions from day to day, such as being excited to be pregnant or being  concerned that something may go wrong with the pregnancy and baby.  You may have more vivid and strange dreams.  You may have changes in your hair. These can include thickening of your hair, rapid growth, and changes in texture. Some women also have hair loss during or after pregnancy, or hair that feels dry or thin. Your hair will most likely return to normal after your baby is born. WHAT TO EXPECT AT YOUR PRENATAL VISITS During a routine prenatal visit:  You will be weighed to make sure you and the baby are growing normally.  Your blood pressure will be taken.  Your abdomen will be measured to track your baby's growth.  The fetal heartbeat will be listened to starting around week 10 or 12 of your pregnancy.  Test results from any previous visits will be discussed. Your health care provider may ask you:  How you are feeling.  If you are feeling the baby move.  If you have had any abnormal symptoms, such as leaking fluid, bleeding, severe headaches, or abdominal cramping.  If you are using any tobacco products, including cigarettes, chewing tobacco, and electronic cigarettes.  If you have any questions. Other tests that may be performed during your first trimester include:  Blood tests to find your blood type and to check for the presence of any previous infections. They will also be used to check for low iron levels (anemia) and Rh antibodies. Later in the pregnancy, blood tests for diabetes will be done along with other tests if problems develop.  Urine tests to check for infections, diabetes, or protein in the urine.  An ultrasound to confirm the proper growth and development of the baby.  An amniocentesis to check for possible genetic problems.  Fetal screens for spina bifida and Down syndrome.  You may need other tests to make sure you and the baby are doing well.  HIV (human immunodeficiency virus) testing. Routine prenatal testing includes screening for HIV, unless  you choose not to have this test. HOME CARE INSTRUCTIONS  Medicines  Follow your health care provider's instructions regarding medicine use. Specific medicines may be either safe or unsafe to take during pregnancy.  Take your prenatal vitamins as directed.  If you develop constipation, try taking a stool softener if your health care provider approves. Diet  Eat regular, well-balanced meals. Choose a variety of foods, such as meat or vegetable-based protein, fish, milk and low-fat dairy products, vegetables, fruits, and whole grain breads and cereals. Your health care provider will help you determine the amount of weight gain that is right for you.  Avoid raw meat and uncooked cheese. These carry germs that can cause birth defects in the baby.  Eating four or five small meals rather than three large meals a day may help relieve nausea and vomiting. If you start to feel nauseous, eating a few soda crackers can be helpful. Drinking liquids between meals instead of  during meals also seems to help nausea and vomiting.  If you develop constipation, eat more high-fiber foods, such as fresh vegetables or fruit and whole grains. Drink enough fluids to keep your urine clear or pale yellow. Activity and Exercise  Exercise only as directed by your health care provider. Exercising will help you:  Control your weight.  Stay in shape.  Be prepared for labor and delivery.  Experiencing pain or cramping in the lower abdomen or low back is a good sign that you should stop exercising. Check with your health care provider before continuing normal exercises.  Try to avoid standing for long periods of time. Move your legs often if you must stand in one place for a long time.  Avoid heavy lifting.  Wear low-heeled shoes, and practice good posture.  You may continue to have sex unless your health care provider directs you otherwise. Relief of Pain or Discomfort  Wear a good support bra for breast  tenderness.   Take warm sitz baths to soothe any pain or discomfort caused by hemorrhoids. Use hemorrhoid cream if your health care provider approves.   Rest with your legs elevated if you have leg cramps or low back pain.  If you develop varicose veins in your legs, wear support hose. Elevate your feet for 15 minutes, 3-4 times a day. Limit salt in your diet. Prenatal Care  Schedule your prenatal visits by the twelfth week of pregnancy. They are usually scheduled monthly at first, then more often in the last 2 months before delivery.  Write down your questions. Take them to your prenatal visits.  Keep all your prenatal visits as directed by your health care provider. Safety  Wear your seat belt at all times when driving.  Make a list of emergency phone numbers, including numbers for family, friends, the hospital, and police and fire departments. General Tips  Ask your health care provider for a referral to a local prenatal education class. Begin classes no later than at the beginning of month 6 of your pregnancy.  Ask for help if you have counseling or nutritional needs during pregnancy. Your health care provider can offer advice or refer you to specialists for help with various needs.  Do not use hot tubs, steam rooms, or saunas.  Do not douche or use tampons or scented sanitary pads.  Do not cross your legs for long periods of time.  Avoid cat litter boxes and soil used by cats. These carry germs that can cause birth defects in the baby and possibly loss of the fetus by miscarriage or stillbirth.  Avoid all smoking, herbs, alcohol, and medicines not prescribed by your health care provider. Chemicals in these affect the formation and growth of the baby.  Do not use any tobacco products, including cigarettes, chewing tobacco, and electronic cigarettes. If you need help quitting, ask your health care provider. You may receive counseling support and other resources to help you  quit.  Schedule a dentist appointment. At home, brush your teeth with a soft toothbrush and be gentle when you floss. SEEK MEDICAL CARE IF:   You have dizziness.  You have mild pelvic cramps, pelvic pressure, or nagging pain in the abdominal area.  You have persistent nausea, vomiting, or diarrhea.  You have a bad smelling vaginal discharge.  You have pain with urination.  You notice increased swelling in your face, hands, legs, or ankles. SEEK IMMEDIATE MEDICAL CARE IF:   You have a fever.  You are leaking  fluid from your vagina.  You have spotting or bleeding from your vagina.  You have severe abdominal cramping or pain.  You have rapid weight gain or loss.  You vomit blood or material that looks like coffee grounds.  You are exposed to [place] measles and have never had them.  You are exposed to fifth disease or chickenpox.  You develop a severe headache.  You have shortness of breath.  You have any kind of trauma, such as from a fall or a car accident.   This information is not intended to replace advice given to you by your health care provider. Make sure you discuss any questions you have with your health care provider.   Document Released: 10/22/2001 Document Revised: 11/18/2014 Document Reviewed: 09/07/2013 Elsevier Interactive Patient Education [employer]2016 [employer]vier Inc.

## 2016-09-10 NOTE — Progress Notes (Signed)
Subjective:    Anna [name] is a 30 y.o. G2P1001 with di/di twin gestation at [redacted]w[redacted]d, by LMP c/w 6 week scan, being seen today for her first obstetrical visit.  Her obstetrical history is significant for having bleeding early in this pregnancy and being diagnosed with a large subchorionic hemorrhage as per 08/28/2016 scan. Patient still reports occasionally small amount of bleeding and cramping.  She has a history of previous uncomplicated pregnancy resulting in post term singleton SVD. Patient does intend to breast feed. Pregnancy history fully reviewed.  Vitals:   09/10/16 1458  BP: 124/82  Pulse: 76  Weight: 119 lb (54 kg)    HISTORY: OB History  Gravida Para Term Preterm AB Living  2 1 1  0 0 1  SAB TAB Ectopic Multiple Live Births  0 0 0 0 1    # Outcome Date GA Lbr Len/2nd Weight Sex Delivery Anes PTL Lv  2 Current           1 Term 09/12/05 [redacted]w[redacted]d    Vag-Spont   LIV     Past Medical History:  Diagnosis Date  . Acne 06/15/2015  . Anxiety and depression 06/15/2015   No past surgical history on file. Family History  Problem Relation Age of Onset  . Other Neg Hx      Exam    Uterus:     Pelvic Exam: Normal pap [facility] in 2016.  Scant brown discharge noted on ultrasound transvaginal probe. Speculum exam deferred.    Perineum: No Hemorrhoids, Normal Perineum   Vulva: normal   Cervix: no cervical motion tenderness   Adnexa: normal adnexa and no mass, fullness, tenderness   Bony Pelvis: average  System: Breast:  normal appearance, no masses or tenderness   Skin: normal coloration and turgor, no rashes   Neurologic: oriented, normal, negative   Extremities: normal strength, tone, and muscle mass   HEENT PERRLA, extra ocular movement intact and sclera clear, anicteric   Mouth/Teeth mucous membranes moist, pharynx normal without lesions and dental hygiene good   Neck supple and no masses   Cardiovascular: regular rate and rhythm   Respiratory:  appears well, vitals  normal, no respiratory distress, acyanotic, normal RR, chest clear, no wheezing, crepitations, rhonchi, normal symmetric air entry   Abdomen: soft, non-tender; bowel sounds normal; no masses,  no organomegaly   Urinary: urethral meatus normal   US Ob Comp Less 14 Wks  Result Date: 08/21/2016 CLINICAL DATA:  Vaginal bleeding and cramping starting earlier today. Positive pregnancy test. EXAM: TWIN OBSTETRIC <14WK [place] AND TRANSVAGINAL OB US COMPARISON:  None. FINDINGS: Number of IUPs:  2 Chorionicity/Amnionicity:  Dichorionic/diamniotic TWIN 1 Yolk sac:  Visualized. Embryo:  Visualized. Cardiac Activity: Visualized. Heart Rate: 80 bpm CRL:  2.5  mm   5 w 5 d                  [place] EDC: 04/18/2017 TWIN 2 Yolk sac:  Visualized. Embryo:  Visualized. Cardiac Activity: Not visualized. Heart Rate: N/A bpm CRL:  3.3  mm   5 w 6 d                  [place] EDC: 04/17/2017. Subchorionic hemorrhage:  Small to moderate Maternal uterus/adnexae: 14 mm intramural uterine fibroid. Probable corpus luteum cyst right ovary. Left ovary unremarkable. Small volume intraperitoneal free fluid noted in the cul-de-sac. IMPRESSION: Twin intrauterine gestation identified with embryonic heart activity seen in twin 1 , but not twin 2.  Follow-up ultrasound in 7 days could be performed to assess for appropriate progression of pregnancy. Electronically Signed   By: Kennith CenterEric  Mansell M.D.   On: 08/21/2016 14:25   [place] Ob Comp Addl Gest Less 14 Wks  Result Date: 08/21/2016 CLINICAL DATA:  Vaginal bleeding and cramping starting earlier today. Positive pregnancy test. EXAM: TWIN OBSTETRIC <14WK US AND TRANSVAGINAL OB US COMPARISON:  None. FINDINGS: Number of IUPs:  2 Chorionicity/Amnionicity:  Dichorionic/diamniotic TWIN 1 Yolk sac:  Visualized. Embryo:  Visualized. Cardiac Activity: Visualized. Heart Rate: 80 bpm CRL:  2.5  mm   5 w 5 d                  US EDC: 04/18/2017 TWIN 2 Yolk sac:  Visualized. Embryo:  Visualized. Cardiac Activity: Not visualized.  Heart Rate: N/A bpm CRL:  3.3  mm   5 w 6 d                  US EDC: 04/17/2017. Subchorionic hemorrhage:  Small to moderate Maternal uterus/adnexae: 14 mm intramural uterine fibroid. Probable corpus luteum cyst right ovary. Left ovary unremarkable. Small volume intraperitoneal free fluid noted in the cul-de-sac. IMPRESSION: Twin intrauterine gestation identified with embryonic heart activity seen in twin 1 , but not twin 2. Follow-up ultrasound in 7 days could be performed to assess for appropriate progression of pregnancy. Electronically Signed   By: Kennith CenterEric  Mansell M.D.   On: 08/21/2016 14:25   [place] Ob Transvaginal  Result Date: 08/28/2016 CLINICAL DATA:  Bleeding for 1 week.  Follow-up examination. EXAM: TWIN OBSTETRICAL ULTRASOUND <14 WKS COMPARISON:  08/21/2016 FINDINGS: Number of IUPs:  2 Chorionicity/Amnionicity:  Dichorionic-diamniotic (thick membrane) TWIN 1 Yolk sac:  Visualized. Embryo:  Visualized. Cardiac Activity: Visualized. Heart Rate: 53 bpm CRL:   6.5  mm   6 w 3 d                  US EDC: 04/20/2017 TWIN 2 Yolk sac:  Visualized. Embryo:  Visualized. Cardiac Activity: Visualized. Heart Rate: 71 bpm CRL:   6.9  mm   6 w 3 d                  US EDC: 04/20/2017 Subchorionic hemorrhage:  Large subchorionic hemorrhage. Maternal uterus/adnexae: No adnexal mass. Moderate amount of pelvic free fluid. IMPRESSION: 1. Live dichorionic -diamniotic twin gestations with relatively low heart rate. Continued attention on follow-up examination is recommended. 2. Moderate amount of pelvic free fluid. 3. Large subchorionic hemorrhage. Electronically Signed   By: Elige KoHetal  Patel   On: 08/28/2016 10:59   [place] Ob Transvaginal  Result Date: 08/21/2016 CLINICAL DATA:  Vaginal bleeding and cramping starting earlier today. Positive pregnancy test. EXAM: TWIN OBSTETRIC <14WK US AND TRANSVAGINAL OB US COMPARISON:  None. FINDINGS: Number of IUPs:  2 Chorionicity/Amnionicity:  Dichorionic/diamniotic TWIN 1 Yolk sac:   Visualized. Embryo:  Visualized. Cardiac Activity: Visualized. Heart Rate: 80 bpm CRL:  2.5  mm   5 w 5 d                  US EDC: 04/18/2017 TWIN 2 Yolk sac:  Visualized. Embryo:  Visualized. Cardiac Activity: Not visualized. Heart Rate: N/A bpm CRL:  3.3  mm   5 w 6 d                  US EDC: 04/17/2017. Subchorionic hemorrhage:  Small to moderate Maternal uterus/adnexae: 14 mm intramural uterine fibroid. Probable corpus  luteum cyst right ovary. Left ovary unremarkable. Small volume intraperitoneal free fluid noted in the cul-de-sac. IMPRESSION: Twin intrauterine gestation identified with embryonic heart activity seen in twin 1 , but not twin 2. Follow-up ultrasound in 7 days could be performed to assess for appropriate progression of pregnancy. Electronically Signed   By: Kennith CenterEric  Mansell M.D.   On: 08/21/2016 14:25    09/10/2016 Office ultrasound:  Viable Twin A with FHR 175 with CRL consistent with dates, unable to visualize Twin B definitively, no cardiac activity visualized. Large SCH noted around Twin B.   Assessment:    Pregnancy: G2P1001 Patient Active Problem List   Diagnosis Date Noted  . Dichorionic diamniotic twin pregnancy, antepartum 09/10/2016  . Supervision of high-risk pregnancy 09/10/2016  . Large subchorionic hemorrhage in first trimester 09/10/2016  . Anxiety and depression 06/15/2015     Plan:   Patient is very concerned about viability of Twin B; wants scan today.  [facility].  Initial labs drawn today. Continue Prenatal vitamins. Genetic Screening discussed: desires [facility] to be done next visit.  Ultrasound discussed; fetal survey: to be ordered later. Problem list reviewed and updated.  The nature of [facility] with multiple MDs and other Advanced Practice Providers was explained to patient; also emphasized that residents, students are part of our team. Follow up in 4  weeks or earlier if indicated.  Bleeding and routine obstetric precautions reviewed.   Tereso NewcomerANYANWU,Allea Kassner A, MD 09/10/2016

## 2016-09-10 NOTE — Progress Notes (Signed)
Bedside US shows twin pregnancy. No cardiac activity seen on Twin B. Twin A has FHR 175 and measures consistant with dates

## 2016-09-11 ENCOUNTER — Telehealth: Payer: Self-pay | Admitting: *Deleted

## 2016-09-11 ENCOUNTER — Ambulatory Visit: Payer: 59

## 2016-09-11 ENCOUNTER — Encounter: Payer: Self-pay | Admitting: Obstetrics & Gynecology

## 2016-09-11 ENCOUNTER — Ambulatory Visit (HOSPITAL_COMMUNITY)
Admission: RE | Admit: 2016-09-11 | Discharge: 2016-09-11 | Disposition: A | Discharge status: Home / Self Care | Payer: 59 | Source: Ambulatory Visit | Attending: Obstetrics & Gynecology | Admitting: Obstetrics & Gynecology

## 2016-09-11 DIAGNOSIS — O208 Other hemorrhage in early pregnancy: Secondary | ICD-10-CM | POA: Diagnosis not present

## 2016-09-11 DIAGNOSIS — O30041 Twin pregnancy, dichorionic/diamniotic, first trimester: Secondary | ICD-10-CM | POA: Diagnosis not present

## 2016-09-11 DIAGNOSIS — O468X1 Other antepartum hemorrhage, first trimester: Secondary | ICD-10-CM

## 2016-09-11 DIAGNOSIS — Z3A08 8 weeks gestation of pregnancy: Secondary | ICD-10-CM | POA: Diagnosis not present

## 2016-09-11 DIAGNOSIS — Z712 Person consulting for explanation of examination or test findings: Secondary | ICD-10-CM

## 2016-09-11 DIAGNOSIS — O0991 Supervision of high risk pregnancy, unspecified, first trimester: Secondary | ICD-10-CM

## 2016-09-11 DIAGNOSIS — O418X12 Other specified disorders of amniotic fluid and membranes, first trimester, fetus 2: Secondary | ICD-10-CM

## 2016-09-11 DIAGNOSIS — O30001 Twin pregnancy, unspecified number of placenta and unspecified number of amniotic sacs, first trimester: Secondary | ICD-10-CM | POA: Insufficient documentation

## 2016-09-11 LAB — PRENATAL PROFILE (SOLSTAS)
Antibody Screen: NEGATIVE
Basophils Absolute: 0 cells/uL (ref 0–200)
Basophils Relative: 0 %
Eosinophils Absolute: 91 cells/uL (ref 15–500)
Eosinophils Relative: 1 %
HCT: 39.7 % (ref 35.0–45.0)
HIV 1&2 Ab, 4th Generation: NONREACTIVE
Hemoglobin: 13.1 g/dL (ref 11.7–15.5)
Hepatitis B Surface Ag: NEGATIVE
Lymphocytes Relative: 20 %
Lymphs Abs: 1820 cells/uL (ref 850–3900)
MCH: 29.6 pg (ref 27.0–33.0)
MCHC: 33 g/dL (ref 32.0–36.0)
MCV: 89.8 fL (ref 80.0–100.0)
MPV: 9.8 fL (ref 7.5–12.5)
Monocytes Absolute: 546 cells/uL (ref 200–950)
Monocytes Relative: 6 %
Neutro Abs: 6643 cells/uL (ref 1500–7800)
Neutrophils Relative %: 73 %
Platelets: 252 10*3/uL (ref 140–400)
RBC: 4.42 MIL/uL (ref 3.80–5.10)
RDW: 12.9 % (ref 11.0–15.0)
Rh Type: POSITIVE
Rubella: 2.19 Index — ABNORMAL HIGH (ref <0.90)
WBC: 9.1 10*3/uL (ref 3.8–10.8)

## 2016-09-11 LAB — PAIN MGMT, PROFILE 6 CONF W/O MM, U
6 Acetylmorphine: NEGATIVE ng/mL (ref <10)
Alcohol Metabolites: NEGATIVE ng/mL (ref <500)
Amphetamines: NEGATIVE ng/mL (ref <500)
Barbiturates: NEGATIVE ng/mL (ref <300)
Benzodiazepines: NEGATIVE ng/mL (ref <100)
Cocaine Metabolite: NEGATIVE ng/mL (ref <150)
Creatinine: 34.3 mg/dL (ref >=20.0)
Marijuana Metabolite: NEGATIVE ng/mL (ref <20)
Methadone Metabolite: NEGATIVE ng/mL (ref <100)
Opiates: NEGATIVE ng/mL (ref <100)
Oxidant: NEGATIVE ug/mL (ref <200)
Oxycodone: NEGATIVE ng/mL (ref <100)
Phencyclidine: NEGATIVE ng/mL (ref <25)
Please note:: 0
pH: 7.11 (ref 4.5–9.0)

## 2016-09-11 LAB — CULTURE, OB URINE: Organism ID, Bacteria: NO GROWTH

## 2016-09-11 NOTE — Progress Notes (Addendum)
Patient presented to office today after having her u/s. Reviewed results with provide Anna [name] and patient.  Patient is currently pregnant with twins. However baby B is much smaller in size and shape. No yolk sac or fetal pole seen within the B gestation. Baby is doing well at this time. Patient became very tearful. I have [name] but patient has decline to speak with her at this time.  I have advised patient to continue with her prenatal vitamins and try to get some rest today. Patient already has her u/s scheduled in the next upcoming weeks. Patient voice understanding.

## 2016-09-11 NOTE — Telephone Encounter (Signed)
Spoke to pt about US results. Pt was very tearful and wanted reassurance that Baby A was not going to have any adverse reactions to [facility] and [facility]. Advised pt that I would speak to Dr. Macon LargeAnyanwu to see if there are some other test other than NIPS to check Baby A. Pt was very thankful and expressed understanding.

## 2016-09-16 LAB — CYSTIC FIBROSIS DIAGNOSTIC STUDY

## 2016-09-19 ENCOUNTER — Other Ambulatory Visit: Payer: 59

## 2016-09-19 NOTE — Progress Notes (Signed)
Pt here for reassuring FHR check on Ultrasound. Gestational Sac B is still visible on US but no fetal pole or yolk sac. Gestational Sac A shows viable IUP with FHR 158-176. Pt was very thankful for US and reassurance provided today.

## 2016-10-07 ENCOUNTER — Ambulatory Visit (INDEPENDENT_AMBULATORY_CARE_PROVIDER_SITE_OTHER): Payer: 59 | Admitting: Obstetrics & Gynecology

## 2016-10-07 VITALS — BP 121/81 | HR 83 | Wt 120.0 lb

## 2016-10-07 DIAGNOSIS — O468X1 Other antepartum hemorrhage, first trimester: Secondary | ICD-10-CM

## 2016-10-07 DIAGNOSIS — O418X12 Other specified disorders of amniotic fluid and membranes, first trimester, fetus 2: Secondary | ICD-10-CM

## 2016-10-07 DIAGNOSIS — Z3481 Encounter for supervision of other normal pregnancy, first trimester: Secondary | ICD-10-CM

## 2016-10-07 DIAGNOSIS — Z3689 Encounter for other specified antenatal screening: Secondary | ICD-10-CM

## 2016-10-07 DIAGNOSIS — O3121X1 Continuing pregnancy after intrauterine death of one fetus or more, first trimester, fetus 1: Secondary | ICD-10-CM

## 2016-10-07 DIAGNOSIS — IMO0001 Reserved for inherently not codable concepts without codable children: Secondary | ICD-10-CM

## 2016-10-07 NOTE — Progress Notes (Signed)
   PRENATAL VISIT NOTE  Subjective:  Anna [name] is a 30 y.o. G2P1001 at 3432w6d being seen today for ongoing prenatal care.  She is currently monitored for the following issues for this low-risk pregnancy and has Anxiety and depression; Twin pregnancy with early intrauterine death of Twin B at 9 weeks, first trimester; Supervision of normal pregnancy; and Large subchorionic hemorrhage in first trimester on her problem list.  Patient reports no bleeding and no cramping.  Contractions: Not present. Vag. Bleeding: None.  Movement: Absent. Denies leaking of fluid.   The following portions of the patient's history were reviewed and updated as appropriate: allergies, current medications, past family history, past medical history, past social history, past surgical history and problem list. Problem list updated.  Objective:   Vitals:   10/07/16 1518  BP: 121/81  Pulse: 83  Weight: 120 lb (54.4 kg)    Fetal Status: Fetal Heart Rate (bpm): 178   Movement: Absent     General:  Alert, oriented and cooperative. Patient is in no acute distress.  Skin: Skin is warm and dry. No rash noted.   Cardiovascular: Normal heart rate noted  Respiratory: Normal respiratory effort, no problems with respiration noted  Abdomen: Soft, gravid, appropriate for gestational age. Pain/Pressure: Absent     Pelvic:  Cervical exam deferred        Extremities: Normal range of motion.  Edema: None  Mental Status: Normal mood and affect. Normal behavior. Normal judgment and thought content.   Assessment and Plan:  Pregnancy: G2P1001 at 932w6d  1. Twin pregnancy with early intrauterine death of Twin B at 9 weeks, first trimester Patient desires NIPS today. This was ordered.  - Genetic Screening  2. Subchorionic hemorrhage of placenta in first trimester, fetus 2 of multiple gestation No further bleeding. Pelvic rest restrictions lifted.  3. Encounter for fetal anatomic survey 4. Encounter for supervision of other  normal pregnancy in first trimester Patient interested in Babyscripts, will be signed up for this.  Anatomy scan ordered.  - US MFM OB COMP + 14 WK; Future No other complaints or concerns.  Routine obstetric precautions reviewed. Please refer to After Visit Summary for other counseling recommendations.  Return in about 7 weeks (around 11/25/2016) for OB Visit (20 week Babyscripts).   Tereso NewcomerUgonna A Natasha Paulson, MD

## 2016-10-07 NOTE — Patient Instructions (Signed)
Return to clinic for any scheduled appointments or obstetric concerns, or go to [facility] for evaluation Second Trimester of Pregnancy The second trimester is from week 13 through week 28 (months 4 through 6). The second trimester is often a time when you feel your best. Your body has also adjusted to being pregnant, and you begin to feel better physically. Usually, morning sickness has lessened or quit completely, you may have more energy, and you may have an increase in appetite. The second trimester is also a time when the fetus is growing rapidly. At the end of the sixth month, the fetus is about 9 inches long and weighs about 1 pounds. You will likely begin to feel the baby move (quickening) between 18 and 20 weeks of the pregnancy. Body changes during your second trimester Your body continues to go through many changes during your second trimester. The changes vary from woman to woman.  Your weight will continue to increase. You will notice your lower abdomen bulging out.  You may begin to get stretch marks on your hips, abdomen, and breasts.  You may develop headaches that can be relieved by medicines. The medicines should be approved by your health care provider.  You may urinate more often because the fetus is pressing on your bladder.  You may develop or continue to have heartburn as a result of your pregnancy.  You may develop constipation because certain hormones are causing the muscles that push waste through your intestines to slow down.  You may develop hemorrhoids or swollen, bulging veins (varicose veins).  You may have back pain. This is caused by:  Weight gain.  Pregnancy hormones that are relaxing the joints in your pelvis.  A shift in weight and the muscles that support your balance.  Your breasts will continue to grow and they will continue to become tender.  Your gums may bleed and may be sensitive to brushing and flossing.  Dark spots or blotches (chloasma, mask of  pregnancy) may develop on your face. This will likely fade after the baby is born.  A dark line from your belly button to the pubic area (linea nigra) may appear. This will likely fade after the baby is born.  You may have changes in your hair. These can include thickening of your hair, rapid growth, and changes in texture. Some women also have hair loss during or after pregnancy, or hair that feels dry or thin. Your hair will most likely return to normal after your baby is born. What to expect at prenatal visits During a routine prenatal visit:  You will be weighed to make sure you and the fetus are growing normally.  Your blood pressure will be taken.  Your abdomen will be measured to track your baby's growth.  The fetal heartbeat will be listened to.  Any test results from the previous visit will be discussed. Your health care provider may ask you:  How you are feeling.  If you are feeling the baby move.  If you have had any abnormal symptoms, such as leaking fluid, bleeding, severe headaches, or abdominal cramping.  If you are using any tobacco products, including cigarettes, chewing tobacco, and electronic cigarettes.  If you have any questions. Other tests that may be performed during your second trimester include:  Blood tests that check for:  Low iron levels (anemia).  Gestational diabetes (between 24 and 28 weeks).  Rh antibodies. This is to check for a protein on red blood cells (Rh factor).    Urine tests to check for infections, diabetes, or protein in the urine.  An ultrasound to confirm the proper growth and development of the baby.  An amniocentesis to check for possible genetic problems.  Fetal screens for spina bifida and Down syndrome.  HIV (human immunodeficiency virus) testing. Routine prenatal testing includes screening for HIV, unless you choose not to have this test. Follow these instructions at home: Eating and drinking  Continue to eat regular,  healthy meals.  Avoid raw meat, uncooked cheese, cat litter boxes, and soil used by cats. These carry germs that can cause birth defects in the baby.  Take your prenatal vitamins.  Take 1500-2000 mg of calcium daily starting at the 20th week of pregnancy until you deliver your baby.  If you develop constipation:  Take over-the-counter or prescription medicines.  Drink enough fluid to keep your urine clear or pale yellow.  Eat foods that are high in fiber, such as fresh fruits and vegetables, whole grains, and beans.  Limit foods that are high in fat and processed sugars, such as fried and sweet foods. Activity  Exercise only as directed by your health care provider. Experiencing uterine cramps is a good sign to stop exercising.  Avoid heavy lifting, wear low heel shoes, and practice good posture.  Wear your seat belt at all times when driving.  Rest with your legs elevated if you have leg cramps or low back pain.  Wear a good support bra for breast tenderness.  Do not use hot tubs, steam rooms, or saunas. Lifestyle  Avoid all smoking, herbs, alcohol, and unprescribed drugs. These chemicals affect the formation and growth of the baby.  Do not use any products that contain nicotine or tobacco, such as cigarettes and e-cigarettes. If you need help quitting, ask your health care provider.  A sexual relationship may be continued unless your health care provider directs you otherwise. General instructions  Follow your health care provider's instructions regarding medicine use. There are medicines that are either safe or unsafe to take during pregnancy.  Take warm sitz baths to soothe any pain or discomfort caused by hemorrhoids. Use hemorrhoid cream if your health care provider approves.  If you develop varicose veins, wear support hose. Elevate your feet for 15 minutes, 3-4 times a day. Limit salt in your diet.  Visit your dentist if you have not gone yet during your  pregnancy. Use a soft toothbrush to brush your teeth and be gentle when you floss.  Keep all follow-up prenatal visits as told by your health care provider. This is important. Contact a health care provider if:  You have dizziness.  You have mild pelvic cramps, pelvic pressure, or nagging pain in the abdominal area.  You have persistent nausea, vomiting, or diarrhea.  You have a bad smelling vaginal discharge.  You have pain with urination. Get help right away if:  You have a fever.  You are leaking fluid from your vagina.  You have spotting or bleeding from your vagina.  You have severe abdominal cramping or pain.  You have rapid weight gain or weight loss.  You have shortness of breath with chest pain.  You notice sudden or extreme swelling of your face, hands, ankles, feet, or legs.  You have not felt your baby move in over an hour.  You have severe headaches that do not go away with medicine.  You have vision changes. Summary  The second trimester is from week 13 through week 28 (months 4 through   6). It is also a time when the fetus is growing rapidly.  Your body goes through many changes during pregnancy. The changes vary from woman to woman.  Avoid all smoking, herbs, alcohol, and unprescribed drugs. These chemicals affect the formation and growth your baby.  Do not use any tobacco products, such as cigarettes, chewing tobacco, and e-cigarettes. If you need help quitting, ask your health care provider.  Contact your health care provider if you have any questions. Keep all prenatal visits as told by your health care provider. This is important. This information is not intended to replace advice given to you by your health care provider. Make sure you discuss any questions you have with your health care provider. Document Released: 10/22/2001 Document Revised: 04/04/2016 Document Reviewed: 12/29/2012 Elsevier Interactive Patient Education  2017 [employer]  

## 2016-10-11 ENCOUNTER — Telehealth: Payer: Self-pay | Admitting: *Deleted

## 2016-10-11 NOTE — Telephone Encounter (Signed)
-----   Message from IdavilleJacinda S Battle sent at 10/10/2016  1:54 PM EST ----- Regarding: [facility] Test [facility] and they are going to have to cancel her test because they can not give an accurate result due to the vanishing twin. Holding off of canceling the test until they hear back from medical staff to discuss other alternative  [phone] Case [id number]

## 2016-10-11 NOTE — Telephone Encounter (Signed)
Spoke to pt, informed her that [facility] could not run the test due to the circumstances but we would offer her a quad screen and she would have an anatomy US in a few weeks to make sure everything is developing properly, pt acknowledged.

## 2016-10-16 ENCOUNTER — Ambulatory Visit (HOSPITAL_COMMUNITY)
Admission: EM | Admit: 2016-10-16 | Discharge: 2016-10-16 | Disposition: A | Discharge status: Home / Self Care | Payer: 59 | Attending: Family Medicine | Admitting: Family Medicine

## 2016-10-16 ENCOUNTER — Encounter (HOSPITAL_COMMUNITY): Payer: Self-pay | Admitting: Family Medicine

## 2016-10-16 ENCOUNTER — Telehealth: Payer: Self-pay | Admitting: Internal Medicine

## 2016-10-16 DIAGNOSIS — N760 Acute vaginitis: Secondary | ICD-10-CM | POA: Insufficient documentation

## 2016-10-16 DIAGNOSIS — F329 Major depressive disorder, single episode, unspecified: Secondary | ICD-10-CM | POA: Insufficient documentation

## 2016-10-16 DIAGNOSIS — O99342 Other mental disorders complicating pregnancy, second trimester: Secondary | ICD-10-CM | POA: Insufficient documentation

## 2016-10-16 DIAGNOSIS — O99512 Diseases of the respiratory system complicating pregnancy, second trimester: Secondary | ICD-10-CM | POA: Diagnosis not present

## 2016-10-16 DIAGNOSIS — Z79899 Other long term (current) drug therapy: Secondary | ICD-10-CM | POA: Insufficient documentation

## 2016-10-16 DIAGNOSIS — F419 Anxiety disorder, unspecified: Secondary | ICD-10-CM | POA: Diagnosis not present

## 2016-10-16 DIAGNOSIS — O23592 Infection of other part of genital tract in pregnancy, second trimester: Secondary | ICD-10-CM | POA: Insufficient documentation

## 2016-10-16 DIAGNOSIS — N898 Other specified noninflammatory disorders of vagina: Secondary | ICD-10-CM | POA: Diagnosis present

## 2016-10-16 DIAGNOSIS — Z3A14 14 weeks gestation of pregnancy: Secondary | ICD-10-CM | POA: Diagnosis not present

## 2016-10-16 DIAGNOSIS — J0100 Acute maxillary sinusitis, unspecified: Secondary | ICD-10-CM

## 2016-10-16 DIAGNOSIS — O30002 Twin pregnancy, unspecified number of placenta and unspecified number of amniotic sacs, second trimester: Secondary | ICD-10-CM | POA: Insufficient documentation

## 2016-10-16 LAB — POCT URINALYSIS DIP (DEVICE)
Bilirubin Urine: NEGATIVE
Glucose, UA: NEGATIVE mg/dL
Hgb urine dipstick: NEGATIVE
Ketones, ur: NEGATIVE mg/dL
Nitrite: NEGATIVE
Protein, ur: NEGATIVE mg/dL
Specific Gravity, Urine: 1.02 (ref 1.005–1.030)
Urobilinogen, UA: 0.2 mg/dL (ref 0.0–1.0)
pH: 6.5 (ref 5.0–8.0)

## 2016-10-16 MED ORDER — AMOXICILLIN 500 MG PO CAPS: 500.0000 mg | ORAL_CAPSULE | Freq: Three times a day (TID) | ORAL | 0 refills | Status: DC

## 2016-10-16 MED ORDER — FLUCONAZOLE 150 MG PO TABS: 150.0000 mg | ORAL_TABLET | Freq: Once | ORAL | 0 refills | Status: AC

## 2016-10-16 NOTE — Telephone Encounter (Signed)
Patient Name: Anna [name]  DOB: May 17, 1986    Initial Comment Caller States they are [redacted] weeks pregnant and feeling hot with headaches and thinks they have a yeast infection.    Nurse Assessment  Nurse: Nicanor Bakeosta, RN, Marylene LandAngela Date/Time Lamount Cohen(Eastern Time): 10/16/2016 4:01:55 PM  Confirm and document reason for call. If symptomatic, describe symptoms. ---Caller states she is [redacted] weeks pregnant, feeling hot with headaches x3wk and believes she may have a yeast infection. She has a yellowish color and odor.  Does the patient have any new or worsening symptoms? ---Yes  Will a triage be completed? ---Yes  Related visit to physician within the last 2 weeks? ---Yes  Does the PT have any chronic conditions? (i.e. diabetes, asthma, etc.) ---No  Is the patient pregnant or possibly pregnant? (Ask all females between the ages of 8212-55) ---Yes  How many weeks gestation? ---14 wks  What is the estimated delivery date? ---2017-04-15  Total number of pregnancies including current? ---2  Number of live births? ---1  Have you felt decreased fetal movement? ---Early Pregnancy - No Fetal Movement Felt Yet  Is this a behavioral health or substance abuse call? ---No     Guidelines    Guideline Title Affirmed Question Affirmed Notes  Pregnancy - Vaginal Discharge Abnormal color vaginal discharge (i.e., yellow, green, gray)   Pregnancy - Headache [1] Sinus pain of forehead AND [2] yellow or green nasal discharge    Final Disposition User   See Physician within 24 Hours [place], RN, Marylene Landngela    Comments  No voicemail option on primary listed number.  Pt to call her OB-gyn for appt.   Referrals  REFERRED TO PCP OFFICE   Disagree/Comply: Comply    Disagree/Comply: Comply

## 2016-10-16 NOTE — ED Provider Notes (Signed)
[facility]    CSN: [id number] Arrival date & time: 10/16/16  1713     History   Chief Complaint Chief Complaint  Patient presents with  . Vaginal Discharge  . Cough    HPI Anna [name] is a 30 y.o. female.   This is a 30 year old woman who presents for evaluation of symptoms of upper respiratory infection and what she thinks is a yeast infection. She is [redacted] weeks pregnant. 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 She tried calling her obesity day but they were unable to work her in.  Patient says that she has fullness in her sinuses as well as heaviness in her chest. She also has ear pressure.  She's developed a yellow vaginal discharge which is different from the watery discharge she expected with pregnancy. She's never had discharge quite like this.      Past Medical History:  Diagnosis Date  . Acne 06/15/2015  . Anxiety and depression 06/15/2015    Patient Active Problem List   Diagnosis Date Noted  . Twin pregnancy with early intrauterine death of Twin B at 9 weeks, first trimester 09/10/2016  . Supervision of normal pregnancy 09/10/2016  . Large subchorionic hemorrhage in first trimester 09/10/2016  . Anxiety and depression 06/15/2015    History reviewed. No pertinent surgical history.  OB History    Gravida Para Term Preterm AB Living   2 1 1  0 0 1   SAB TAB Ectopic Multiple Live Births   0 0 0 0 1       Home Medications    Prior to Admission medications   Medication Sig Start Date End Date Taking? Authorizing Provider  prenatal vitamin w/FE, FA (NATACHEW) 29-1 MG CHEW chewable tablet Chew 1 tablet by mouth daily at 12 noon.   Yes Historical Provider, MD  amoxicillin (AMOXIL) 500 MG capsule Take 1 capsule (500 mg total) by mouth 3 (three) times daily. 10/16/16   Elvina SidleKurt Kensley Lares, MD  fluconazole (DIFLUCAN) 150 MG tablet Take 1 tablet (150 mg total) by mouth once. Repeat if needed 10/16/16 10/16/16  Elvina SidleKurt Lurline Caver, MD    Family  History Family History  Problem Relation Age of Onset  . Other Neg Hx     Social History Social History  Substance Use Topics  . Smoking status: Never Smoker  . Smokeless tobacco: Never Used  . Alcohol use 0.0 oz/week     Comment: occasional     Allergies   Lexapro [escitalopram]   Review of Systems Review of Systems  Constitutional: Negative.   HENT: Positive for ear pain and facial swelling.   Respiratory: Positive for chest tightness.   Cardiovascular: Negative.   Gastrointestinal: Negative.   Genitourinary: Positive for vaginal discharge. Negative for pelvic pain and vaginal bleeding.  Neurological: Negative.   Psychiatric/Behavioral: Negative.      Physical Exam Triage Vital Signs ED Triage Vitals  Enc Vitals Group     BP      Pulse      Resp      Temp      Temp src      SpO2      Weight      Height      Head Circumference      Peak Flow      Pain Score      Pain Loc      Pain Edu?      Excl. in GC?    No data found.   Updated  Vital Signs BP 124/78 (BP Location: Left Arm)   Pulse 88   Temp 99 F (37.2 C) (Oral)   Resp 20   LMP 07/09/2016   SpO2 99%    Physical Exam  Constitutional: She is oriented to person, place, and time. She appears well-developed and well-nourished.  HENT:  Head: Normocephalic.  Right Ear: External ear normal.  Left Ear: External ear normal.  Mouth/Throat: Oropharynx is clear and moist.  Tender maxilla. Mucopurulent nasal discharge.  Eyes: Conjunctivae and EOM are normal.  Neck: Normal range of motion. Neck supple.  Cardiovascular: Normal rate, regular rhythm and normal heart sounds.   Pulmonary/Chest: Effort normal.  Genitourinary:  Genitourinary Comments: Lumpy vaginal discharge that is slightly yellow in color but mostly white.  Musculoskeletal: Normal range of motion.  Neurological: She is alert and oriented to person, place, and time.  Skin: Skin is warm and dry.     UC Treatments / Results   Labs (all labs ordered are listed, but only abnormal results are displayed) Labs Reviewed  CERVICOVAGINAL ANCILLARY ONLY    EKG  EKG Interpretation None       Radiology No results found.  Procedures Procedures (including critical care time)  Medications Ordered in UC Medications - No data to display   Initial Impression / Assessment and Plan / UC Course  I have reviewed the triage vital signs and the nursing notes.  Pertinent labs & imaging results that were available during my care of the patient were reviewed by me and considered in my medical decision making (see chart for details).  Clinical Course     Final Clinical Impressions(s) / UC Diagnoses   Final diagnoses:  Acute maxillary sinusitis, recurrence not specified  Acute vaginitis    New Prescriptions New Prescriptions   AMOXICILLIN (AMOXIL) 500 MG CAPSULE    Take 1 capsule (500 mg total) by mouth 3 (three) times daily.   FLUCONAZOLE (DIFLUCAN) 150 MG TABLET    Take 1 tablet (150 mg total) by mouth once. Repeat if needed     Elvina SidleKurt Khalik Pewitt, MD 10/16/16 [phone]

## 2016-10-16 NOTE — ED Triage Notes (Signed)
[facility].  The patient reported a vaginal discharge that started today that she believed to be a yeast infection.  The patient also reported a cough that was dry and non-productive along with nasal congestion and sneezing x 3 weeks that has gotten worse the last 2 days.

## 2016-10-16 NOTE — Telephone Encounter (Signed)
She should call her OB

## 2016-10-17 LAB — CERVICOVAGINAL ANCILLARY ONLY: Wet Prep (BD Affirm): NEGATIVE

## 2016-10-17 NOTE — Telephone Encounter (Signed)
Pt went to ED

## 2016-10-17 NOTE — Telephone Encounter (Signed)
Called pt, no answer and VM not set up, will try again later

## 2016-10-18 LAB — CERVICOVAGINAL ANCILLARY ONLY
Chlamydia: NEGATIVE
Neisseria Gonorrhea: NEGATIVE

## 2016-10-31 ENCOUNTER — Encounter (HOSPITAL_COMMUNITY): Payer: Self-pay | Admitting: Obstetrics & Gynecology

## 2016-11-11 NOTE — L&D Delivery Note (Signed)
Delivery Note  PDIOL. Augmented with cytotec, pitocin, and arom.    At 11:55 AM a viable female was delivered via Vaginal, Spontaneous Delivery (Presentation: OA ).  APGAR: 8, 9; weight pending  .   Placenta status: retained, manually extracted .  Cord: 3v  with the following complications: immediate pph 600 ml.  Cord pH: not obtained  Retained placenta manually extracted. Immediate pph 600 ml, treated with in and out bladder cath, 40 units pitocin, methergine 0.2 mg IM x1, and cytotec 800 mcg buccal  Anesthesia:  epidural Episiotomy: None Lacerations: 2nd degree;Perineal Suture Repair: 3.0 vicryl Est. Blood Loss (mL):  600  Mom to postpartum.  Baby to Couplet care / Skin to Skin.  Anna [name] 04/22/2017, 12:50 PM

## 2016-11-12 ENCOUNTER — Ambulatory Visit (HOSPITAL_COMMUNITY): Payer: 59

## 2016-11-14 ENCOUNTER — Ambulatory Visit (HOSPITAL_COMMUNITY)
Admission: RE | Admit: 2016-11-14 | Discharge: 2016-11-14 | Disposition: A | Discharge status: Home / Self Care | Payer: 59 | Source: Ambulatory Visit | Attending: Obstetrics & Gynecology | Admitting: Obstetrics & Gynecology

## 2016-11-14 DIAGNOSIS — O3112X0 Continuing pregnancy after spontaneous abortion of one fetus or more, second trimester, not applicable or unspecified: Secondary | ICD-10-CM | POA: Diagnosis not present

## 2016-11-14 DIAGNOSIS — Z3689 Encounter for other specified antenatal screening: Secondary | ICD-10-CM

## 2016-11-14 DIAGNOSIS — O30002 Twin pregnancy, unspecified number of placenta and unspecified number of amniotic sacs, second trimester: Secondary | ICD-10-CM | POA: Insufficient documentation

## 2016-11-14 DIAGNOSIS — Z3A18 18 weeks gestation of pregnancy: Secondary | ICD-10-CM | POA: Insufficient documentation

## 2016-11-14 DIAGNOSIS — Z363 Encounter for antenatal screening for malformations: Secondary | ICD-10-CM | POA: Diagnosis not present

## 2016-11-14 DIAGNOSIS — Z3481 Encounter for supervision of other normal pregnancy, first trimester: Secondary | ICD-10-CM

## 2016-11-26 ENCOUNTER — Ambulatory Visit (INDEPENDENT_AMBULATORY_CARE_PROVIDER_SITE_OTHER): Payer: 59 | Admitting: Family Medicine

## 2016-11-26 ENCOUNTER — Encounter: Payer: Self-pay | Admitting: Family Medicine

## 2016-11-26 VITALS — BP 107/66 | HR 85 | Wt 125.0 lb

## 2016-11-26 DIAGNOSIS — Z3481 Encounter for supervision of other normal pregnancy, first trimester: Secondary | ICD-10-CM

## 2016-11-26 DIAGNOSIS — O3121X1 Continuing pregnancy after intrauterine death of one fetus or more, first trimester, fetus 1: Secondary | ICD-10-CM

## 2016-11-26 NOTE — Progress Notes (Signed)
   PRENATAL VISIT NOTE  Subjective:  Anna [name] is a 31 y.o. G2P1001 at 4425w0d being seen today for ongoing prenatal care.  She is currently monitored for the following issues for this low-risk pregnancy and has Anxiety and depression; Twin pregnancy with early intrauterine death of Twin B at 9 weeks, first trimester; Supervision of normal pregnancy; and Large subchorionic hemorrhage in first trimester on her problem list.  Patient reports no complaints.  Contractions: Not present. Vag. Bleeding: None.  Movement: Present. Denies leaking of fluid.   The following portions of the patient's history were reviewed and updated as appropriate: allergies, current medications, past family history, past medical history, past social history, past surgical history and problem list. Problem list updated.  Objective:   Vitals:   11/26/16 1327  BP: 107/66  Pulse: 85  Weight: 125 lb (56.7 kg)    Fetal Status: Fetal Heart Rate (bpm): 157 Fundal Height: 20 cm Movement: Present     General:  Alert, oriented and cooperative. Patient is in no acute distress.  Skin: Skin is warm and dry. No rash noted.   Cardiovascular: Normal heart rate noted  Respiratory: Normal respiratory effort, no problems with respiration noted  Abdomen: Soft, gravid, appropriate for gestational age. Pain/Pressure: Absent     Pelvic:  Cervical exam deferred        Extremities: Normal range of motion.  Edema: None  Mental Status: Normal mood and affect. Normal behavior. Normal judgment and thought content.   Assessment and Plan:  Pregnancy: G2P1001 at 3225w0d  1. Encounter for supervision of other normal pregnancy in first trimester Continue routine prenatal care. Baby Scripts patient - US MFM OB FOLLOW UP; Future - to complete anatomy  2. Twin pregnancy with early intrauterine death of Twin B at 9 weeks, first trimester   General obstetric precautions including but not limited to vaginal bleeding, contractions, leaking of  fluid and fetal movement were reviewed in detail with the patient. Please refer to After Visit Summary for other counseling recommendations.  No Follow-up on file.   Reva Boresanya S Ayslin Kundert, MD

## 2016-11-26 NOTE — Patient Instructions (Signed)
Second Trimester of Pregnancy The second trimester is from week 13 through week 28 (months 4 through 6). The second trimester is often a time when you feel your best. Your body has also adjusted to being pregnant, and you begin to feel better physically. Usually, morning sickness has lessened or quit completely, you may have more energy, and you may have an increase in appetite. The second trimester is also a time when the fetus is growing rapidly. At the end of the sixth month, the fetus is about 9 inches long and weighs about 1 pounds. You will likely begin to feel the baby move (quickening) between 18 and 20 weeks of the pregnancy. Body changes during your second trimester Your body continues to go through many changes during your second trimester. The changes vary from woman to woman.  Your weight will continue to increase. You will notice your lower abdomen bulging out.  You may begin to get stretch marks on your hips, abdomen, and breasts.  You may develop headaches that can be relieved by medicines. The medicines should be approved by your health care provider.  You may urinate more often because the fetus is pressing on your bladder.  You may develop or continue to have heartburn as a result of your pregnancy.  You may develop constipation because certain hormones are causing the muscles that push waste through your intestines to slow down.  You may develop hemorrhoids or swollen, bulging veins (varicose veins).  You may have back pain. This is caused by:  Weight gain.  Pregnancy hormones that are relaxing the joints in your pelvis.  A shift in weight and the muscles that support your balance.  Your breasts will continue to grow and they will continue to become tender.  Your gums may bleed and may be sensitive to brushing and flossing.  Dark spots or blotches (chloasma, mask of pregnancy) may develop on your face. This will likely fade after the baby is born.  A dark line  from your belly button to the pubic area (linea nigra) may appear. This will likely fade after the baby is born.  You may have changes in your hair. These can include thickening of your hair, rapid growth, and changes in texture. Some women also have hair loss during or after pregnancy, or hair that feels dry or thin. Your hair will most likely return to normal after your baby is born. What to expect at prenatal visits During a routine prenatal visit:  You will be weighed to make sure you and the fetus are growing normally.  Your blood pressure will be taken.  Your abdomen will be measured to track your baby's growth.  The fetal heartbeat will be listened to.  Any test results from the previous visit will be discussed. Your health care provider may ask you:  How you are feeling.  If you are feeling the baby move.  If you have had any abnormal symptoms, such as leaking fluid, bleeding, severe headaches, or abdominal cramping.  If you are using any tobacco products, including cigarettes, chewing tobacco, and electronic cigarettes.  If you have any questions. Other tests that may be performed during your second trimester include:  Blood tests that check for:  Low iron levels (anemia).  Gestational diabetes (between 24 and 28 weeks).  Rh antibodies. This is to check for a protein on red blood cells (Rh factor).  Urine tests to check for infections, diabetes, or protein in the urine.  An ultrasound to   confirm the proper growth and development of the baby.  An amniocentesis to check for possible genetic problems.  Fetal screens for spina bifida and Down syndrome.  HIV (human immunodeficiency virus) testing. Routine prenatal testing includes screening for HIV, unless you choose not to have this test. Follow these instructions at home: Eating and drinking  Continue to eat regular, healthy meals.  Avoid raw meat, uncooked cheese, cat litter boxes, and soil used by cats. These  carry germs that can cause birth defects in the baby.  Take your prenatal vitamins.  Take 1500-2000 mg of calcium daily starting at the 20th week of pregnancy until you deliver your baby.  If you develop constipation:  Take over-the-counter or prescription medicines.  Drink enough fluid to keep your urine clear or pale yellow.  Eat foods that are high in fiber, such as fresh fruits and vegetables, whole grains, and beans.  Limit foods that are high in fat and processed sugars, such as fried and sweet foods. Activity  Exercise only as directed by your health care provider. Experiencing uterine cramps is a good sign to stop exercising.  Avoid heavy lifting, wear low heel shoes, and practice good posture.  Wear your seat belt at all times when driving.  Rest with your legs elevated if you have leg cramps or low back pain.  Wear a good support bra for breast tenderness.  Do not use hot tubs, steam rooms, or saunas. Lifestyle  Avoid all smoking, herbs, alcohol, and unprescribed drugs. These chemicals affect the formation and growth of the baby.  Do not use any products that contain nicotine or tobacco, such as cigarettes and e-cigarettes. If you need help quitting, ask your health care provider.  A sexual relationship may be continued unless your health care provider directs you otherwise. General instructions  Follow your health care provider's instructions regarding medicine use. There are medicines that are either safe or unsafe to take during pregnancy.  Take warm sitz baths to soothe any pain or discomfort caused by hemorrhoids. Use hemorrhoid cream if your health care provider approves.  If you develop varicose veins, wear support hose. Elevate your feet for 15 minutes, 3-4 times a day. Limit salt in your diet.  Visit your dentist if you have not gone yet during your pregnancy. Use a soft toothbrush to brush your teeth and be gentle when you floss.  Keep all follow-up  prenatal visits as told by your health care provider. This is important. Contact a health care provider if:  You have dizziness.  You have mild pelvic cramps, pelvic pressure, or nagging pain in the abdominal area.  You have persistent nausea, vomiting, or diarrhea.  You have a bad smelling vaginal discharge.  You have pain with urination. Get help right away if:  You have a fever.  You are leaking fluid from your vagina.  You have spotting or bleeding from your vagina.  You have severe abdominal cramping or pain.  You have rapid weight gain or weight loss.  You have shortness of breath with chest pain.  You notice sudden or extreme swelling of your face, hands, ankles, feet, or legs.  You have not felt your baby move in over an hour.  You have severe headaches that do not go away with medicine.  You have vision changes. Summary  The second trimester is from week 13 through week 28 (months 4 through 6). It is also a time when the fetus is growing rapidly.  Your body goes   through many changes during pregnancy. The changes vary from woman to woman.  Avoid all smoking, herbs, alcohol, and unprescribed drugs. These chemicals affect the formation and growth your baby.  Do not use any tobacco products, such as cigarettes, chewing tobacco, and e-cigarettes. If you need help quitting, ask your health care provider.  Contact your health care provider if you have any questions. Keep all prenatal visits as told by your health care provider. This is important. This information is not intended to replace advice given to you by your health care provider. Make sure you discuss any questions you have with your health care provider. Document Released: 10/22/2001 Document Revised: 04/04/2016 Document Reviewed: 12/29/2012 Elsevier Interactive Patient Education  2017 [employer]   Breastfeeding Deciding to breastfeed is one of the best choices you can make for you and your baby. A  change in hormones during pregnancy causes your breast tissue to grow and increases the number and size of your milk ducts. These hormones also allow proteins, sugars, and fats from your blood supply to make breast milk in your milk-producing glands. Hormones prevent breast milk from being released before your baby is born as well as prompt milk flow after birth. Once breastfeeding has begun, thoughts of your baby, as well as his or her sucking or crying, can stimulate the release of milk from your milk-producing glands. Benefits of breastfeeding For Your Baby  Your first milk (colostrum) helps your baby's digestive system function better.  There are antibodies in your milk that help your baby fight off infections.  Your baby has a lower incidence of asthma, allergies, and sudden infant death syndrome.  The nutrients in breast milk are better for your baby than infant formulas and are designed uniquely for your baby's needs.  Breast milk improves your baby's brain development.  Your baby is less likely to develop other conditions, such as childhood obesity, asthma, or type 2 diabetes mellitus. For You  Breastfeeding helps to create a very special bond between you and your baby.  Breastfeeding is convenient. Breast milk is always available at the correct temperature and costs nothing.  Breastfeeding helps to burn calories and helps you lose the weight gained during pregnancy.  Breastfeeding makes your uterus contract to its prepregnancy size faster and slows bleeding (lochia) after you give birth.  Breastfeeding helps to lower your risk of developing type 2 diabetes mellitus, osteoporosis, and breast or ovarian cancer later in life. Signs that your baby is hungry Early Signs of Hunger  Increased alertness or activity.  Stretching.  Movement of the head from side to side.  Movement of the head and opening of the mouth when the corner of the mouth or cheek is stroked  (rooting).  Increased sucking sounds, smacking lips, cooing, sighing, or squeaking.  Hand-to-mouth movements.  Increased sucking of fingers or hands. Late Signs of Hunger  Fussing.  Intermittent crying. Extreme Signs of Hunger  Signs of extreme hunger will require calming and consoling before your baby will be able to breastfeed successfully. Do not wait for the following signs of extreme hunger to occur before you initiate breastfeeding:  Restlessness.  A loud, strong cry.  Screaming. Breastfeeding basics  Breastfeeding Initiation  Find a comfortable place to sit or lie down, with your neck and back well supported.  Place a pillow or rolled up blanket under your baby to bring him or her to the level of your breast (if you are seated). Nursing pillows are specially designed to help   support your arms and your baby while you breastfeed.  Make sure that your baby's abdomen is facing your abdomen.  Gently massage your breast. With your fingertips, massage from your chest wall toward your nipple in a circular motion. This encourages milk flow. You may need to continue this action during the feeding if your milk flows slowly.  Support your breast with 4 fingers underneath and your thumb above your nipple. Make sure your fingers are well away from your nipple and your baby's mouth.  Stroke your baby's lips gently with your finger or nipple.  When your baby's mouth is open wide enough, quickly bring your baby to your breast, placing your entire nipple and as much of the colored area around your nipple (areola) as possible into your baby's mouth.  More areola should be visible above your baby's upper lip than below the lower lip.  Your baby's tongue should be between his or her lower gum and your breast.  Ensure that your baby's mouth is correctly positioned around your nipple (latched). Your baby's lips should create a seal on your breast and be turned out (everted).  It is common  for your baby to suck about 2-3 minutes in order to start the flow of breast milk. Latching  Teaching your baby how to latch on to your breast properly is very important. An improper latch can cause nipple pain and decreased milk supply for you and poor weight gain in your baby. Also, if your baby is not latched onto your nipple properly, he or she may swallow some air during feeding. This can make your baby fussy. Burping your baby when you switch breasts during the feeding can help to get rid of the air. However, teaching your baby to latch on properly is still the best way to prevent fussiness from swallowing air while breastfeeding. Signs that your baby has successfully latched on to your nipple:  Silent tugging or silent sucking, without causing you pain.  Swallowing heard between every 3-4 sucks.  Muscle movement above and in front of his or her ears while sucking. Signs that your baby has not successfully latched on to nipple:  Sucking sounds or smacking sounds from your baby while breastfeeding.  Nipple pain. If you think your baby has not latched on correctly, slip your finger into the corner of your baby's mouth to break the suction and place it between your baby's gums. Attempt breastfeeding initiation again. Signs of Successful Breastfeeding  Signs from your baby:  A gradual decrease in the number of sucks or complete cessation of sucking.  Falling asleep.  Relaxation of his or her body.  Retention of a small amount of milk in his or her mouth.  Letting go of your breast by himself or herself. Signs from you:  Breasts that have increased in firmness, weight, and size 1-3 hours after feeding.  Breasts that are softer immediately after breastfeeding.  Increased milk volume, as well as a change in milk consistency and color by the fifth day of breastfeeding.  Nipples that are not sore, cracked, or bleeding. Signs That Your Baby is Getting Enough Milk  Wetting at least  1-2 diapers during the first 24 hours after birth.  Wetting at least 5-6 diapers every 24 hours for the first week after birth. The urine should be clear or pale yellow by 5 days after birth.  Wetting 6-8 diapers every 24 hours as your baby continues to grow and develop.  At least 3 stools in   a 24-hour period by age 5 days. The stool should be soft and yellow.  At least 3 stools in a 24-hour period by age 7 days. The stool should be seedy and yellow.  No loss of weight greater than 10% of birth weight during the first 3 days of age.  Average weight gain of 4-7 ounces (113-198 g) per week after age 4 days.  Consistent daily weight gain by age 5 days, without weight loss after the age of 2 weeks. After a feeding, your baby may spit up a small amount. This is common. Breastfeeding frequency and duration Frequent feeding will help you make more milk and can prevent sore nipples and breast engorgement. Breastfeed when you feel the need to reduce the fullness of your breasts or when your baby shows signs of hunger. This is called "breastfeeding on demand." Avoid introducing a pacifier to your baby while you are working to establish breastfeeding (the first 4-6 weeks after your baby is born). After this time you may choose to use a pacifier. Research has shown that pacifier use during the first year of a baby's life decreases the risk of sudden infant death syndrome (SIDS). Allow your baby to feed on each breast as long as he or she wants. Breastfeed until your baby is finished feeding. When your baby unlatches or falls asleep while feeding from the first breast, offer the second breast. Because newborns are often sleepy in the first few weeks of life, you may need to awaken your baby to get him or her to feed. Breastfeeding times will vary from baby to baby. However, the following rules can serve as a guide to help you ensure that your baby is properly fed:  Newborns (babies 4 weeks of age or younger)  may breastfeed every 1-3 hours.  Newborns should not go longer than 3 hours during the day or 5 hours during the night without breastfeeding.  You should breastfeed your baby a minimum of 8 times in a 24-hour period until you begin to introduce solid foods to your baby at around 6 months of age. Breast milk pumping Pumping and storing breast milk allows you to ensure that your baby is exclusively fed your breast milk, even at times when you are unable to breastfeed. This is especially important if you are going back to work while you are still breastfeeding or when you are not able to be present during feedings. Your lactation consultant can give you guidelines on how long it is safe to store breast milk. A breast pump is a machine that allows you to pump milk from your breast into a sterile bottle. The pumped breast milk can then be stored in a refrigerator or freezer. Some breast pumps are operated by hand, while others use electricity. Ask your lactation consultant which type will work best for you. Breast pumps can be purchased, but some hospitals and breastfeeding support groups lease breast pumps on a monthly basis. A lactation consultant can teach you how to hand express breast milk, if you prefer not to use a pump. Caring for your breasts while you breastfeed Nipples can become dry, cracked, and sore while breastfeeding. The following recommendations can help keep your breasts moisturized and healthy:  Avoid using soap on your nipples.  Wear a supportive bra. Although not required, special nursing bras and tank tops are designed to allow access to your breasts for breastfeeding without taking off your entire bra or top. Avoid wearing underwire-style bras or extremely tight   bras.  Air dry your nipples for 3-4minutes after each feeding.  Use only cotton bra pads to absorb leaked breast milk. Leaking of breast milk between feedings is normal.  Use lanolin on your nipples after breastfeeding.  Lanolin helps to maintain your skin's normal moisture barrier. If you use pure lanolin, you do not need to wash it off before feeding your baby again. Pure lanolin is not toxic to your baby. You may also hand express a few drops of breast milk and gently massage that milk into your nipples and allow the milk to air dry. In the first few weeks after giving birth, some women experience extremely full breasts (engorgement). Engorgement can make your breasts feel heavy, warm, and tender to the touch. Engorgement peaks within 3-5 days after you give birth. The following recommendations can help ease engorgement:  Completely empty your breasts while breastfeeding or pumping. You may want to start by applying warm, moist heat (in the shower or with warm water-soaked hand towels) just before feeding or pumping. This increases circulation and helps the milk flow. If your baby does not completely empty your breasts while breastfeeding, pump any extra milk after he or she is finished.  Wear a snug bra (nursing or regular) or tank top for 1-2 days to signal your body to slightly decrease milk production.  Apply ice packs to your breasts, unless this is too uncomfortable for you.  Make sure that your baby is latched on and positioned properly while breastfeeding. If engorgement persists after 48 hours of following these recommendations, contact your health care provider or a lactation consultant. Overall health care recommendations while breastfeeding  Eat healthy foods. Alternate between meals and snacks, eating 3 of each per day. Because what you eat affects your breast milk, some of the foods may make your baby more irritable than usual. Avoid eating these foods if you are sure that they are negatively affecting your baby.  Drink milk, fruit juice, and water to satisfy your thirst (about 10 glasses a day).  Rest often, relax, and continue to take your prenatal vitamins to prevent fatigue, stress, and  anemia.  Continue breast self-awareness checks.  Avoid chewing and smoking tobacco. Chemicals from cigarettes that pass into breast milk and exposure to secondhand smoke may harm your baby.  Avoid alcohol and drug use, including marijuana. Some medicines that may be harmful to your baby can pass through breast milk. It is important to ask your health care provider before taking any medicine, including all over-the-counter and prescription medicine as well as vitamin and herbal supplements. It is possible to become pregnant while breastfeeding. If birth control is desired, ask your health care provider about options that will be safe for your baby. Contact a health care provider if:  You feel like you want to stop breastfeeding or have become frustrated with breastfeeding.  You have painful breasts or nipples.  Your nipples are cracked or bleeding.  Your breasts are red, tender, or warm.  You have a swollen area on either breast.  You have a fever or chills.  You have nausea or vomiting.  You have drainage other than breast milk from your nipples.  Your breasts do not become full before feedings by the fifth day after you give birth.  You feel sad and depressed.  Your baby is too sleepy to eat well.  Your baby is having trouble sleeping.  Your baby is wetting less than 3 diapers in a 24-hour period.  Your baby   has less than 3 stools in a 24-hour period.  Your baby's skin or the white part of his or her eyes becomes yellow.  Your baby is not gaining weight by 5 days of age. Get help right away if:  Your baby is overly tired (lethargic) and does not want to wake up and feed.  Your baby develops an unexplained fever. This information is not intended to replace advice given to you by your health care provider. Make sure you discuss any questions you have with your health care provider. Document Released: 10/28/2005 Document Revised: 04/10/2016 Document Reviewed:  04/21/2013 Elsevier Interactive Patient Education  2017 [employer]  

## 2016-12-10 ENCOUNTER — Ambulatory Visit (HOSPITAL_COMMUNITY): Payer: 59

## 2016-12-12 ENCOUNTER — Ambulatory Visit (HOSPITAL_COMMUNITY)
Admission: RE | Admit: 2016-12-12 | Discharge: 2016-12-12 | Disposition: A | Discharge status: Home / Self Care | Payer: 59 | Source: Ambulatory Visit | Attending: Family Medicine | Admitting: Family Medicine

## 2016-12-12 DIAGNOSIS — Z3481 Encounter for supervision of other normal pregnancy, first trimester: Secondary | ICD-10-CM

## 2016-12-12 DIAGNOSIS — Z3A22 22 weeks gestation of pregnancy: Secondary | ICD-10-CM | POA: Diagnosis not present

## 2016-12-12 DIAGNOSIS — Z362 Encounter for other antenatal screening follow-up: Secondary | ICD-10-CM | POA: Insufficient documentation

## 2016-12-30 ENCOUNTER — Ambulatory Visit (INDEPENDENT_AMBULATORY_CARE_PROVIDER_SITE_OTHER): Payer: 59 | Admitting: Obstetrics & Gynecology

## 2016-12-30 VITALS — BP 113/69 | HR 80 | Wt 133.0 lb

## 2016-12-30 DIAGNOSIS — O3121X0 Continuing pregnancy after intrauterine death of one fetus or more, first trimester, not applicable or unspecified: Secondary | ICD-10-CM

## 2016-12-30 DIAGNOSIS — R102 Pelvic and perineal pain: Secondary | ICD-10-CM

## 2016-12-30 DIAGNOSIS — O26892 Other specified pregnancy related conditions, second trimester: Secondary | ICD-10-CM

## 2016-12-30 DIAGNOSIS — Z3482 Encounter for supervision of other normal pregnancy, second trimester: Secondary | ICD-10-CM

## 2016-12-30 DIAGNOSIS — O3121X1 Continuing pregnancy after intrauterine death of one fetus or more, first trimester, fetus 1: Secondary | ICD-10-CM

## 2016-12-30 LAB — POCT URINALYSIS DIPSTICK
Bilirubin, UA: NEGATIVE
Glucose, UA: NEGATIVE
Ketones, UA: NEGATIVE
Nitrite, UA: NEGATIVE
Spec Grav, UA: <=1.005
Urobilinogen, UA: 0.2
pH, UA: 7.5

## 2016-12-30 NOTE — Progress Notes (Signed)
She is here for a work in visit. She has been having suprapubic and bilateral groin pain for the past month. She denies VB and ROM. She reports good FM.  I don't feel any inguinal adenopathy, no vulvar lesions, and her cervix is closed/thick/high. Reassurance given RTC for 28 week labs

## 2016-12-31 ENCOUNTER — Encounter: Payer: 59 | Admitting: Obstetrics & Gynecology

## 2017-01-01 LAB — URINE CULTURE, OB REFLEX: Organism ID, Bacteria: NO GROWTH

## 2017-01-01 LAB — CULTURE, OB URINE

## 2017-01-17 ENCOUNTER — Ambulatory Visit (INDEPENDENT_AMBULATORY_CARE_PROVIDER_SITE_OTHER): Payer: 59 | Admitting: Obstetrics & Gynecology

## 2017-01-17 VITALS — BP 121/80 | HR 80 | Wt 136.0 lb

## 2017-01-17 DIAGNOSIS — Z3483 Encounter for supervision of other normal pregnancy, third trimester: Secondary | ICD-10-CM | POA: Diagnosis not present

## 2017-01-17 NOTE — Progress Notes (Signed)
   PRENATAL VISIT NOTE  Subjective:  Anna CommonsJudith Faucett is a 31 y.o. G2P1001 at 6540w3d being seen today for ongoing prenatal care.  She is currently monitored for the following issues for this low-risk pregnancy and has Anxiety and depression; Twin pregnancy with early intrauterine death of Twin B at 9 weeks, first trimester; Supervision of normal pregnancy; and Large subchorionic hemorrhage in first trimester on her problem list.  Patient reports no complaints.  Contractions: Not present. Vag. Bleeding: None.  Movement: Present. Denies leaking of fluid.   The following portions of the patient's history were reviewed and updated as appropriate: allergies, current medications, past family history, past medical history, past social history, past surgical history and problem list. Problem list updated.  Objective:   Vitals:   01/17/17 0836  BP: 121/80  Pulse: 80  Weight: 136 lb (61.7 kg)    Fetal Status: Fetal Heart Rate (bpm): 140 Fundal Height: 27 cm Movement: Present     General:  Alert, oriented and cooperative. Patient is in no acute distress.  Skin: Skin is warm and dry. No rash noted.   Cardiovascular: Normal heart rate noted  Respiratory: Normal respiratory effort, no problems with respiration noted  Abdomen: Soft, gravid, appropriate for gestational age. Pain/Pressure: Absent     Pelvic:  Cervical exam deferred        Extremities: Normal range of motion.  Edema: None  Mental Status: Normal mood and affect. Normal behavior. Normal judgment and thought content.   Assessment and Plan:  Pregnancy: G2P1001 at 9240w3d  1. Encounter for supervision of other normal pregnancy in third trimester Third trimester labs today. Considering Tdap, will decide by next visit. - CBC - HIV antibody - RPR - Glucose Tolerance, 2 Hours w/1 Hour Preterm labor symptoms and general obstetric precautions including but not limited to vaginal bleeding, contractions, leaking of fluid and fetal movement were  reviewed in detail with the patient. Please refer to After Visit Summary for other counseling recommendations.  Return in about 4 weeks (around 02/14/2017) for OB Visit (32 week Babyscripts), possible Tdap.   Tereso NewcomerUgonna A Acheron Sugg, MD

## 2017-01-17 NOTE — Patient Instructions (Signed)
Tdap Vaccine (Tetanus, Diphtheria and Pertussis): What You Need to Know 1. Why get vaccinated? Tetanus, diphtheria and pertussis are very serious diseases. Tdap vaccine can protect us from these diseases. And, Tdap vaccine given to pregnant women can protect newborn babies against pertussis. TETANUS (Lockjaw) is rare in the [place] today. It causes painful muscle tightening and stiffness, usually all over the body.  It can lead to tightening of muscles in the head and neck so you can't open your mouth, swallow, or sometimes even breathe. Tetanus kills about 1 out of 10 people who are infected even after receiving the best medical care.  DIPHTHERIA is also rare in the [place] today. It can cause a thick coating to form in the back of the throat.  It can lead to breathing problems, heart failure, paralysis, and death.  PERTUSSIS (Whooping Cough) causes severe coughing spells, which can cause difficulty breathing, vomiting and disturbed sleep.  It can also lead to weight loss, incontinence, and rib fractures. Up to 2 in 100 adolescents and 5 in 100 adults with pertussis are hospitalized or have complications, which could include pneumonia or death.  These diseases are caused by bacteria. Diphtheria and pertussis are spread from person to person through secretions from coughing or sneezing. Tetanus enters the body through cuts, scratches, or wounds. Before vaccines, as many as 200,000 cases of diphtheria, 200,000 cases of pertussis, and hundreds of cases of tetanus, were reported in the [place] each year. Since vaccination began, reports of cases for tetanus and diphtheria have dropped by about 99% and for pertussis by about 80%. 2. Tdap vaccine Tdap vaccine can protect adolescents and adults from tetanus, diphtheria, and pertussis. One dose of Tdap is routinely given at age 11 or 12. People who did not get Tdap at that age should get it as soon as possible. Tdap is especially  important for healthcare professionals and anyone having close contact with a baby younger than 12 months. Pregnant women should get a dose of Tdap during every pregnancy, to protect the newborn from pertussis. Infants are most at risk for severe, life-threatening complications from pertussis. Another vaccine, called Td, protects against tetanus and diphtheria, but not pertussis. A Td booster should be given every 10 years. Tdap may be given as one of these boosters if you have never gotten Tdap before. Tdap may also be given after a severe cut or burn to prevent tetanus infection. Your doctor or the person giving you the vaccine can give you more information. Tdap may safely be given at the same time as other vaccines. 3. Some people should not get this vaccine  A person who has ever had a life-threatening allergic reaction after a previous dose of any diphtheria, tetanus or pertussis containing vaccine, OR has a severe allergy to any part of this vaccine, should not get Tdap vaccine. Tell the person giving the vaccine about any severe allergies.  Anyone who had coma or long repeated seizures within 7 days after a childhood dose of DTP or DTaP, or a previous dose of Tdap, should not get Tdap, unless a cause other than the vaccine was found. They can still get Td.  Talk to your doctor if you: ? have seizures or another nervous system problem, ? had severe pain or swelling after any vaccine containing diphtheria, tetanus or pertussis, ? ever had a condition called Guillain-Barr Syndrome (GBS), ? aren't feeling well on the day the shot is scheduled. 4. Risks With any medicine, including   vaccines, there is a chance of side effects. These are usually mild and go away on their own. Serious reactions are also possible but are rare. Most people who get Tdap vaccine do not have any problems with it. Mild problems following Tdap: (Did not interfere with activities)  Pain where the shot was given (about  3 in 4 adolescents or 2 in 3 adults)  Redness or swelling where the shot was given (about 1 person in 5)  Mild fever of at least 100.4F (up to about 1 in 25 adolescents or 1 in 100 adults)  Headache (about 3 or 4 people in 10)  Tiredness (about 1 person in 3 or 4)  Nausea, vomiting, diarrhea, stomach ache (up to 1 in 4 adolescents or 1 in 10 adults)  Chills, sore joints (about 1 person in 10)  Body aches (about 1 person in 3 or 4)  Rash, swollen glands (uncommon)  Moderate problems following Tdap: (Interfered with activities, but did not require medical attention)  Pain where the shot was given (up to 1 in 5 or 6)  Redness or swelling where the shot was given (up to about 1 in 16 adolescents or 1 in 12 adults)  Fever over 102F (about 1 in 100 adolescents or 1 in 250 adults)  Headache (about 1 in 7 adolescents or 1 in 10 adults)  Nausea, vomiting, diarrhea, stomach ache (up to 1 or 3 people in 100)  Swelling of the entire arm where the shot was given (up to about 1 in 500).  Severe problems following Tdap: (Unable to perform usual activities; required medical attention)  Swelling, severe pain, bleeding and redness in the arm where the shot was given (rare).  Problems that could happen after any vaccine:  People sometimes faint after a medical procedure, including vaccination. Sitting or lying down for about 15 minutes can help prevent fainting, and injuries caused by a fall. Tell your doctor if you feel dizzy, or have vision changes or ringing in the ears.  Some people get severe pain in the shoulder and have difficulty moving the arm where a shot was given. This happens very rarely.  Any medication can cause a severe allergic reaction. Such reactions from a vaccine are very rare, estimated at fewer than 1 in a million doses, and would happen within a few minutes to a few hours after the vaccination. As with any medicine, there is a very remote chance of a vaccine  causing a serious injury or death. The safety of vaccines is always being monitored. For more information, visit: www.cdc.gov/vaccinesafety/ 5. What if there is a serious problem? What should I look for? Look for anything that concerns you, such as signs of a severe allergic reaction, very high fever, or unusual behavior. Signs of a severe allergic reaction can include hives, swelling of the face and throat, difficulty breathing, a fast heartbeat, dizziness, and weakness. These would usually start a few minutes to a few hours after the vaccination. What should I do?  If you think it is a severe allergic reaction or other emergency that can't wait, call 9-1-1 or get the person to the nearest hospital. Otherwise, call your doctor.  Afterward, the reaction should be reported to the Vaccine Adverse Event Reporting System (VAERS). Your doctor might file this report, or you can do it yourself through the VAERS web site at www.vaers.hhs.gov, or by calling 1-800-822-7967. ? VAERS does not give medical advice. 6. The National Vaccine Injury Compensation Program The National   Vaccine Injury Compensation Program (VICP) is a federal program that was created to compensate people who may have been injured by certain vaccines. Persons who believe they may have been injured by a vaccine can learn about the program and about filing a claim by calling 1-800-338-2382 or visiting the VICP website at www.hrsa.gov/vaccinecompensation. There is a time limit to file a claim for compensation. 7. How can I learn more?  Ask your doctor. He or she can give you the vaccine package insert or suggest other sources of information.  Call your local or state health department.  Contact the Centers for Disease Control and Prevention (CDC): ? Call 1-800-232-4636 (1-800-CDC-INFO) or ? Visit CDC's website at www.cdc.gov/vaccines CDC Tdap Vaccine VIS (01/04/14) This information is not intended to replace advice given to you by your  health care provider. Make sure you discuss any questions you have with your health care provider. Document Released: 04/28/2012 Document Revised: 07/18/2016 Document Reviewed: 07/18/2016 [employer] Interactive Patient Education  2017 [employer]  

## 2017-01-18 LAB — GLUCOSE TOLERANCE, 2 HOURS W/ 1HR
Glucose, 1 hour: 95 mg/dL (ref 65–179)
Glucose, 2 hour: 89 mg/dL (ref 65–152)
Glucose, Fasting: 64 mg/dL — ABNORMAL LOW (ref 65–91)

## 2017-01-18 LAB — CBC
Hematocrit: 37.4 % (ref 34.0–46.6)
Hemoglobin: 12.2 g/dL (ref 11.1–15.9)
MCH: 29.8 pg (ref 26.6–33.0)
MCHC: 32.6 g/dL (ref 31.5–35.7)
MCV: 91 fL (ref 79–97)
Platelets: 215 10*3/uL (ref 150–379)
RBC: 4.09 x10E6/uL (ref 3.77–5.28)
RDW: 13.6 % (ref 12.3–15.4)
WBC: 10.9 10*3/uL — ABNORMAL HIGH (ref 3.4–10.8)

## 2017-01-18 LAB — RPR: RPR Ser Ql: NONREACTIVE

## 2017-01-18 LAB — HIV ANTIBODY (ROUTINE TESTING W REFLEX): HIV Screen 4th Generation wRfx: NONREACTIVE

## 2017-02-18 ENCOUNTER — Ambulatory Visit (INDEPENDENT_AMBULATORY_CARE_PROVIDER_SITE_OTHER): Payer: 59 | Admitting: Family Medicine

## 2017-02-18 VITALS — BP 115/76 | HR 80 | Wt 140.0 lb

## 2017-02-18 DIAGNOSIS — Z23 Encounter for immunization: Secondary | ICD-10-CM

## 2017-02-18 DIAGNOSIS — Z3483 Encounter for supervision of other normal pregnancy, third trimester: Secondary | ICD-10-CM

## 2017-02-18 NOTE — Patient Instructions (Signed)
 Third Trimester of Pregnancy The third trimester is from week 28 through week 40 (months 7 through 9). The third trimester is a time when the unborn baby (fetus) is growing rapidly. At the end of the ninth month, the fetus is about 20 inches in length and weighs 6-10 pounds. Body changes during your third trimester Your body will continue to go through many changes during pregnancy. The changes vary from woman to woman. During the third trimester:  Your weight will continue to increase. You can expect to gain 25-35 pounds (11-16 kg) by the end of the pregnancy.  You may begin to get stretch marks on your hips, abdomen, and breasts.  You may urinate more often because the fetus is moving lower into your pelvis and pressing on your bladder.  You may develop or continue to have heartburn. This is caused by increased hormones that slow down muscles in the digestive tract.  You may develop or continue to have constipation because increased hormones slow digestion and cause the muscles that push waste through your intestines to relax.  You may develop hemorrhoids. These are swollen veins (varicose veins) in the rectum that can itch or be painful.  You may develop swollen, bulging veins (varicose veins) in your legs.  You may have increased body aches in the pelvis, back, or thighs. This is due to weight gain and increased hormones that are relaxing your joints.  You may have changes in your hair. These can include thickening of your hair, rapid growth, and changes in texture. Some women also have hair loss during or after pregnancy, or hair that feels dry or thin. Your hair will most likely return to normal after your baby is born.  Your breasts will continue to grow and they will continue to become tender. A yellow fluid (colostrum) may leak from your breasts. This is the first milk you are producing for your baby.  Your belly button may stick out.  You may notice more swelling in your  hands, face, or ankles.  You may have increased tingling or numbness in your hands, arms, and legs. The skin on your belly may also feel numb.  You may feel short of breath because of your expanding uterus.  You may have more problems sleeping. This can be caused by the size of your belly, increased need to urinate, and an increase in your body's metabolism.  You may notice the fetus "dropping," or moving lower in your abdomen (lightening).  You may have increased vaginal discharge.  You may notice your joints feel loose and you may have pain around your pelvic bone.  What to expect at prenatal visits You will have prenatal exams every 2 weeks until week 36. Then you will have weekly prenatal exams. During a routine prenatal visit:  You will be weighed to make sure you and the baby are growing normally.  Your blood pressure will be taken.  Your abdomen will be measured to track your baby's growth.  The fetal heartbeat will be listened to.  Any test results from the previous visit will be discussed.  You may have a cervical check near your due date to see if your cervix has softened or thinned (effaced).  You will be tested for Group B streptococcus. This happens between 35 and 37 weeks.  Your health care provider may ask you:  What your birth plan is.  How you are feeling.  If you are feeling the baby move.  If you have   had any abnormal symptoms, such as leaking fluid, bleeding, severe headaches, or abdominal cramping.  If you are using any tobacco products, including cigarettes, chewing tobacco, and electronic cigarettes.  If you have any questions.  Other tests or screenings that may be performed during your third trimester include:  Blood tests that check for low iron levels (anemia).  Fetal testing to check the health, activity level, and growth of the fetus. Testing is done if you have certain medical conditions or if there are problems during the  pregnancy.  Nonstress test (NST). This test checks the health of your baby to make sure there are no signs of problems, such as the baby not getting enough oxygen. During this test, a belt is placed around your belly. The baby is made to move, and its heart rate is monitored during movement.  What is false labor? False labor is a condition in which you feel small, irregular tightenings of the muscles in the womb (contractions) that usually go away with rest, changing position, or drinking water. These are called Braxton Hicks contractions. Contractions may last for hours, days, or even weeks before true labor sets in. If contractions come at regular intervals, become more frequent, increase in intensity, or become painful, you should see your health care provider. What are the signs of labor?  Abdominal cramps.  Regular contractions that start at 10 minutes apart and become stronger and more frequent with time.  Contractions that start on the top of the uterus and spread down to the lower abdomen and back.  Increased pelvic pressure and dull back pain.  A watery or bloody mucus discharge that comes from the vagina.  Leaking of amniotic fluid. This is also known as your "water breaking." It could be a slow trickle or a gush. Let your health care provider know if it has a color or strange odor. If you have any of these signs, call your health care provider right away, even if it is before your due date. Follow these instructions at home: Medicines  Follow your health care provider's instructions regarding medicine use. Specific medicines may be either safe or unsafe to take during pregnancy.  Take a prenatal vitamin that contains at least 600 micrograms (mcg) of folic acid.  If you develop constipation, try taking a stool softener if your health care provider approves. Eating and drinking  Eat a balanced diet that includes fresh fruits and vegetables, whole grains, good sources of protein  such as meat, eggs, or tofu, and low-fat dairy. Your health care provider will help you determine the amount of weight gain that is right for you.  Avoid raw meat and uncooked cheese. These carry germs that can cause birth defects in the baby.  If you have low calcium intake from food, talk to your health care provider about whether you should take a daily calcium supplement.  Eat four or five small meals rather than three large meals a day.  Limit foods that are high in fat and processed sugars, such as fried and sweet foods.  To prevent constipation: ? Drink enough fluid to keep your urine clear or pale yellow. ? Eat foods that are high in fiber, such as fresh fruits and vegetables, whole grains, and beans. Activity  Exercise only as directed by your health care provider. Most women can continue their usual exercise routine during pregnancy. Try to exercise for 30 minutes at least 5 days a week. Stop exercising if you experience uterine contractions.  Avoid   heavy lifting.  Do not exercise in extreme heat or humidity, or at high altitudes.  Wear low-heel, comfortable shoes.  Practice good posture.  You may continue to have sex unless your health care provider tells you otherwise. Relieving pain and discomfort  Take frequent breaks and rest with your legs elevated if you have leg cramps or low back pain.  Take warm sitz baths to soothe any pain or discomfort caused by hemorrhoids. Use hemorrhoid cream if your health care provider approves.  Wear a good support bra to prevent discomfort from breast tenderness.  If you develop varicose veins: ? Wear support pantyhose or compression stockings as told by your healthcare provider. ? Elevate your feet for 15 minutes, 3-4 times a day. Prenatal care  Write down your questions. Take them to your prenatal visits.  Keep all your prenatal visits as told by your health care provider. This is important. Safety  Wear your seat belt at  all times when driving.  Make a list of emergency phone numbers, including numbers for family, friends, the hospital, and police and fire departments. General instructions  Avoid cat litter boxes and soil used by cats. These carry germs that can cause birth defects in the baby. If you have a cat, ask someone to clean the litter box for you.  Do not travel far distances unless it is absolutely necessary and only with the approval of your health care provider.  Do not use hot tubs, steam rooms, or saunas.  Do not drink alcohol.  Do not use any products that contain nicotine or tobacco, such as cigarettes and e-cigarettes. If you need help quitting, ask your health care provider.  Do not use any medicinal herbs or unprescribed drugs. These chemicals affect the formation and growth of the baby.  Do not douche or use tampons or scented sanitary pads.  Do not cross your legs for long periods of time.  To prepare for the arrival of your baby: ? Take prenatal classes to understand, practice, and ask questions about labor and delivery. ? Make a trial run to the hospital. ? Visit the hospital and tour the maternity area. ? Arrange for maternity or paternity leave through employers. ? Arrange for family and friends to take care of pets while you are in the hospital. ? Purchase a rear-facing car seat and make sure you know how to install it in your car. ? Pack your hospital bag. ? Prepare the baby's nursery. Make sure to remove all pillows and stuffed animals from the baby's crib to prevent suffocation.  Visit your dentist if you have not gone during your pregnancy. Use a soft toothbrush to brush your teeth and be gentle when you floss. Contact a health care provider if:  You are unsure if you are in labor or if your water has broken.  You become dizzy.  You have mild pelvic cramps, pelvic pressure, or nagging pain in your abdominal area.  You have lower back pain.  You have persistent  nausea, vomiting, or diarrhea.  You have an unusual or bad smelling vaginal discharge.  You have pain when you urinate. Get help right away if:  Your water breaks before 37 weeks.  You have regular contractions less than 5 minutes apart before 37 weeks.  You have a fever.  You are leaking fluid from your vagina.  You have spotting or bleeding from your vagina.  You have severe abdominal pain or cramping.  You have rapid weight loss or weight   gain.  You have shortness of breath with chest pain.  You notice sudden or extreme swelling of your face, hands, ankles, feet, or legs.  Your baby makes fewer than 10 movements in 2 hours.  You have severe headaches that do not go away when you take medicine.  You have vision changes. Summary  The third trimester is from week 28 through week 40, months 7 through 9. The third trimester is a time when the unborn baby (fetus) is growing rapidly.  During the third trimester, your discomfort may increase as you and your baby continue to gain weight. You may have abdominal, leg, and back pain, sleeping problems, and an increased need to urinate.  During the third trimester your breasts will keep growing and they will continue to become tender. A yellow fluid (colostrum) may leak from your breasts. This is the first milk you are producing for your baby.  False labor is a condition in which you feel small, irregular tightenings of the muscles in the womb (contractions) that eventually go away. These are called Braxton Hicks contractions. Contractions may last for hours, days, or even weeks before true labor sets in.  Signs of labor can include: abdominal cramps; regular contractions that start at 10 minutes apart and become stronger and more frequent with time; watery or bloody mucus discharge that comes from the vagina; increased pelvic pressure and dull back pain; and leaking of amniotic fluid. This information is not intended to replace advice  given to you by your health care provider. Make sure you discuss any questions you have with your health care provider. Document Released: 10/22/2001 Document Revised: 04/04/2016 Document Reviewed: 12/29/2012 [employer] Interactive Patient Education  2017 [employer]   Breastfeeding Deciding to breastfeed is one of the best choices you can make for you and your baby. A change in hormones during pregnancy causes your breast tissue to grow and increases the number and size of your milk ducts. These hormones also allow proteins, sugars, and fats from your blood supply to make breast milk in your milk-producing glands. Hormones prevent breast milk from being released before your baby is born as well as prompt milk flow after birth. Once breastfeeding has begun, thoughts of your baby, as well as his or her sucking or crying, can stimulate the release of milk from your milk-producing glands. Benefits of breastfeeding For Your Baby  Your first milk (colostrum) helps your baby's digestive system function better.  There are antibodies in your milk that help your baby fight off infections.  Your baby has a lower incidence of asthma, allergies, and sudden infant death syndrome.  The nutrients in breast milk are better for your baby than infant formulas and are designed uniquely for your baby's needs.  Breast milk improves your baby's brain development.  Your baby is less likely to develop other conditions, such as childhood obesity, asthma, or type 2 diabetes mellitus.  For You  Breastfeeding helps to create a very special bond between you and your baby.  Breastfeeding is convenient. Breast milk is always available at the correct temperature and costs nothing.  Breastfeeding helps to burn calories and helps you lose the weight gained during pregnancy.  Breastfeeding makes your uterus contract to its prepregnancy size faster and slows bleeding (lochia) after you give birth.  Breastfeeding helps  to lower your risk of developing type 2 diabetes mellitus, osteoporosis, and breast or ovarian cancer later in life.  Signs that your baby is hungry Early Signs of Hunger    Increased alertness or activity.  Stretching.  Movement of the head from side to side.  Movement of the head and opening of the mouth when the corner of the mouth or cheek is stroked (rooting).  Increased sucking sounds, smacking lips, cooing, sighing, or squeaking.  Hand-to-mouth movements.  Increased sucking of fingers or hands.  Late Signs of Hunger  Fussing.  Intermittent crying.  Extreme Signs of Hunger Signs of extreme hunger will require calming and consoling before your baby will be able to breastfeed successfully. Do not wait for the following signs of extreme hunger to occur before you initiate breastfeeding:  Restlessness.  A loud, strong cry.  Screaming.  Breastfeeding basics Breastfeeding Initiation  Find a comfortable place to sit or lie down, with your neck and back well supported.  Place a pillow or rolled up blanket under your baby to bring him or her to the level of your breast (if you are seated). Nursing pillows are specially designed to help support your arms and your baby while you breastfeed.  Make sure that your baby's abdomen is facing your abdomen.  Gently massage your breast. With your fingertips, massage from your chest wall toward your nipple in a circular motion. This encourages milk flow. You may need to continue this action during the feeding if your milk flows slowly.  Support your breast with 4 fingers underneath and your thumb above your nipple. Make sure your fingers are well away from your nipple and your baby's mouth.  Stroke your baby's lips gently with your finger or nipple.  When your baby's mouth is open wide enough, quickly bring your baby to your breast, placing your entire nipple and as much of the colored area around your nipple (areola) as possible into  your baby's mouth. ? More areola should be visible above your baby's upper lip than below the lower lip. ? Your baby's tongue should be between his or her lower gum and your breast.  Ensure that your baby's mouth is correctly positioned around your nipple (latched). Your baby's lips should create a seal on your breast and be turned out (everted).  It is common for your baby to suck about 2-3 minutes in order to start the flow of breast milk.  Latching Teaching your baby how to latch on to your breast properly is very important. An improper latch can cause nipple pain and decreased milk supply for you and poor weight gain in your baby. Also, if your baby is not latched onto your nipple properly, he or she may swallow some air during feeding. This can make your baby fussy. Burping your baby when you switch breasts during the feeding can help to get rid of the air. However, teaching your baby to latch on properly is still the best way to prevent fussiness from swallowing air while breastfeeding. Signs that your baby has successfully latched on to your nipple:  Silent tugging or silent sucking, without causing you pain.  Swallowing heard between every 3-4 sucks.  Muscle movement above and in front of his or her ears while sucking.  Signs that your baby has not successfully latched on to nipple:  Sucking sounds or smacking sounds from your baby while breastfeeding.  Nipple pain.  If you think your baby has not latched on correctly, slip your finger into the corner of your baby's mouth to break the suction and place it between your baby's gums. Attempt breastfeeding initiation again. Signs of Successful Breastfeeding Signs from your baby:  A   gradual decrease in the number of sucks or complete cessation of sucking.  Falling asleep.  Relaxation of his or her body.  Retention of a small amount of milk in his or her mouth.  Letting go of your breast by himself or herself.  Signs from  you:  Breasts that have increased in firmness, weight, and size 1-3 hours after feeding.  Breasts that are softer immediately after breastfeeding.  Increased milk volume, as well as a change in milk consistency and color by the fifth day of breastfeeding.  Nipples that are not sore, cracked, or bleeding.  Signs That Your Baby is Getting Enough Milk  Wetting at least 1-2 diapers during the first 24 hours after birth.  Wetting at least 5-6 diapers every 24 hours for the first week after birth. The urine should be clear or pale yellow by 5 days after birth.  Wetting 6-8 diapers every 24 hours as your baby continues to grow and develop.  At least 3 stools in a 24-hour period by age 5 days. The stool should be soft and yellow.  At least 3 stools in a 24-hour period by age 7 days. The stool should be seedy and yellow.  No loss of weight greater than 10% of birth weight during the first 3 days of age.  Average weight gain of 4-7 ounces (113-198 g) per week after age 4 days.  Consistent daily weight gain by age 5 days, without weight loss after the age of 2 weeks.  After a feeding, your baby may spit up a small amount. This is common. Breastfeeding frequency and duration Frequent feeding will help you make more milk and can prevent sore nipples and breast engorgement. Breastfeed when you feel the need to reduce the fullness of your breasts or when your baby shows signs of hunger. This is called "breastfeeding on demand." Avoid introducing a pacifier to your baby while you are working to establish breastfeeding (the first 4-6 weeks after your baby is born). After this time you may choose to use a pacifier. Research has shown that pacifier use during the first year of a baby's life decreases the risk of sudden infant death syndrome (SIDS). Allow your baby to feed on each breast as long as he or she wants. Breastfeed until your baby is finished feeding. When your baby unlatches or falls asleep  while feeding from the first breast, offer the second breast. Because newborns are often sleepy in the first few weeks of life, you may need to awaken your baby to get him or her to feed. Breastfeeding times will vary from baby to baby. However, the following rules can serve as a guide to help you ensure that your baby is properly fed:  Newborns (babies 4 weeks of age or younger) may breastfeed every 1-3 hours.  Newborns should not go longer than 3 hours during the day or 5 hours during the night without breastfeeding.  You should breastfeed your baby a minimum of 8 times in a 24-hour period until you begin to introduce solid foods to your baby at around 6 months of age.  Breast milk pumping Pumping and storing breast milk allows you to ensure that your baby is exclusively fed your breast milk, even at times when you are unable to breastfeed. This is especially important if you are going back to work while you are still breastfeeding or when you are not able to be present during feedings. Your lactation consultant can give you guidelines on how   long it is safe to store breast milk. A breast pump is a machine that allows you to pump milk from your breast into a sterile bottle. The pumped breast milk can then be stored in a refrigerator or freezer. Some breast pumps are operated by hand, while others use electricity. Ask your lactation consultant which type will work best for you. Breast pumps can be purchased, but some hospitals and breastfeeding support groups lease breast pumps on a monthly basis. A lactation consultant can teach you how to hand express breast milk, if you prefer not to use a pump. Caring for your breasts while you breastfeed Nipples can become dry, cracked, and sore while breastfeeding. The following recommendations can help keep your breasts moisturized and healthy:  Avoid using soap on your nipples.  Wear a supportive bra. Although not required, special nursing bras and tank  tops are designed to allow access to your breasts for breastfeeding without taking off your entire bra or top. Avoid wearing underwire-style bras or extremely tight bras.  Air dry your nipples for 3-4minutes after each feeding.  Use only cotton bra pads to absorb leaked breast milk. Leaking of breast milk between feedings is normal.  Use lanolin on your nipples after breastfeeding. Lanolin helps to maintain your skin's normal moisture barrier. If you use pure lanolin, you do not need to wash it off before feeding your baby again. Pure lanolin is not toxic to your baby. You may also hand express a few drops of breast milk and gently massage that milk into your nipples and allow the milk to air dry.  In the first few weeks after giving birth, some women experience extremely full breasts (engorgement). Engorgement can make your breasts feel heavy, warm, and tender to the touch. Engorgement peaks within 3-5 days after you give birth. The following recommendations can help ease engorgement:  Completely empty your breasts while breastfeeding or pumping. You may want to start by applying warm, moist heat (in the shower or with warm water-soaked hand towels) just before feeding or pumping. This increases circulation and helps the milk flow. If your baby does not completely empty your breasts while breastfeeding, pump any extra milk after he or she is finished.  Wear a snug bra (nursing or regular) or tank top for 1-2 days to signal your body to slightly decrease milk production.  Apply ice packs to your breasts, unless this is too uncomfortable for you.  Make sure that your baby is latched on and positioned properly while breastfeeding.  If engorgement persists after 48 hours of following these recommendations, contact your health care provider or a lactation consultant. Overall health care recommendations while breastfeeding  Eat healthy foods. Alternate between meals and snacks, eating 3 of each per  day. Because what you eat affects your breast milk, some of the foods may make your baby more irritable than usual. Avoid eating these foods if you are sure that they are negatively affecting your baby.  Drink milk, fruit juice, and water to satisfy your thirst (about 10 glasses a day).  Rest often, relax, and continue to take your prenatal vitamins to prevent fatigue, stress, and anemia.  Continue breast self-awareness checks.  Avoid chewing and smoking tobacco. Chemicals from cigarettes that pass into breast milk and exposure to secondhand smoke may harm your baby.  Avoid alcohol and drug use, including marijuana. Some medicines that may be harmful to your baby can pass through breast milk. It is important to ask your health care   provider before taking any medicine, including all over-the-counter and prescription medicine as well as vitamin and herbal supplements. It is possible to become pregnant while breastfeeding. If birth control is desired, ask your health care provider about options that will be safe for your baby. Contact a health care provider if:  You feel like you want to stop breastfeeding or have become frustrated with breastfeeding.  You have painful breasts or nipples.  Your nipples are cracked or bleeding.  Your breasts are red, tender, or warm.  You have a swollen area on either breast.  You have a fever or chills.  You have nausea or vomiting.  You have drainage other than breast milk from your nipples.  Your breasts do not become full before feedings by the fifth day after you give birth.  You feel sad and depressed.  Your baby is too sleepy to eat well.  Your baby is having trouble sleeping.  Your baby is wetting less than 3 diapers in a 24-hour period.  Your baby has less than 3 stools in a 24-hour period.  Your baby's skin or the white part of his or her eyes becomes yellow.  Your baby is not gaining weight by 5 days of age. Get help right away  if:  Your baby is overly tired (lethargic) and does not want to wake up and feed.  Your baby develops an unexplained fever. This information is not intended to replace advice given to you by your health care provider. Make sure you discuss any questions you have with your health care provider. Document Released: 10/28/2005 Document Revised: 04/10/2016 Document Reviewed: 04/21/2013 Elsevier Interactive Patient Education  2017 Elsevier Inc.  

## 2017-02-18 NOTE — Progress Notes (Signed)
   PRENATAL VISIT NOTE  Subjective:  Anna [name] is a 31 y.o. G2P1001 at [redacted]w[redacted]d being seen today for ongoing prenatal care.  She is currently monitored for the following issues for this high-risk pregnancy and has Anxiety and depression; Twin pregnancy with early intrauterine death of Twin B at 9 weeks, first trimester; Supervision of normal pregnancy; and Large subchorionic hemorrhage in first trimester on her problem list.  Patient reports no complaints.  Contractions: Not present. Vag. Bleeding: None.  Movement: Present. Denies leaking of fluid.   The following portions of the patient's history were reviewed and updated as appropriate: allergies, current medications, past family history, past medical history, past social history, past surgical history and problem list. Problem list updated.  Objective:   Vitals:   02/18/17 1320  BP: 115/76  Pulse: 80  Weight: 140 lb (63.5 kg)    Fetal Status: Fetal Heart Rate (bpm): 143 Fundal Height: 32 cm Movement: Present     General:  Alert, oriented and cooperative. Patient is in no acute distress.  Skin: Skin is warm and dry. No rash noted.   Cardiovascular: Normal heart rate noted  Respiratory: Normal respiratory effort, no problems with respiration noted  Abdomen: Soft, gravid, appropriate for gestational age. Pain/Pressure: Absent     Pelvic:  Cervical exam deferred        Extremities: Normal range of motion.  Edema: None  Mental Status: Normal mood and affect. Normal behavior. Normal judgment and thought content.   Assessment and Plan:  Pregnancy: G2P1001 at [redacted]w[redacted]d  1. Encounter for supervision of other normal pregnancy in third trimester Continue routine prenatal care.  - Tdap vaccine greater than or equal to 7yo IM  Preterm labor symptoms and general obstetric precautions including but not limited to vaginal bleeding, contractions, leaking of fluid and fetal movement were reviewed in detail with the patient. Please refer to After  Visit Summary for other counseling recommendations.  Return in 4 weeks (on 03/18/2017).   Reva Bores, MD

## 2017-03-12 ENCOUNTER — Encounter: Payer: Self-pay | Admitting: *Deleted

## 2017-03-19 ENCOUNTER — Ambulatory Visit (INDEPENDENT_AMBULATORY_CARE_PROVIDER_SITE_OTHER): Payer: 59 | Admitting: Student

## 2017-03-19 VITALS — BP 133/79 | HR 73 | Wt 144.0 lb

## 2017-03-19 DIAGNOSIS — Z3483 Encounter for supervision of other normal pregnancy, third trimester: Secondary | ICD-10-CM | POA: Diagnosis not present

## 2017-03-19 DIAGNOSIS — Z113 Encounter for screening for infections with a predominantly sexual mode of transmission: Secondary | ICD-10-CM

## 2017-03-19 NOTE — Progress Notes (Signed)
36 wk cultures today   

## 2017-03-19 NOTE — Patient Instructions (Signed)

## 2017-03-19 NOTE — Progress Notes (Signed)
   PRENATAL VISIT NOTE  Subjective:  Anna [name] is a 31 y.o. G2P1001 at 5220w1d being seen today for ongoing prenatal care.  She is currently monitored for the following issues for this low-risk pregnancy and has Anxiety and depression; Twin pregnancy with early intrauterine death of Twin B at 9 weeks, first trimester; Supervision of normal pregnancy; and Large subchorionic hemorrhage in first trimester on her problem list.  Patient reports no complaints.  Contractions: Not present. Vag. Bleeding: None.  Movement: Present. Denies leaking of fluid.   The following portions of the patient's history were reviewed and updated as appropriate: allergies, current medications, past family history, past medical history, past social history, past surgical history and problem list. Problem list updated.  Objective:   Vitals:   03/19/17 1516  BP: 133/79  Pulse: 73  Weight: 144 lb (65.3 kg)    Fetal Status: Fetal Heart Rate (bpm): 135 Fundal Height: 37 cm Movement: Present     General:  Alert, oriented and cooperative. Patient is in no acute distress.  Skin: Skin is warm and dry. No rash noted.   Cardiovascular: Normal heart rate noted  Respiratory: Normal respiratory effort, no problems with respiration noted  Abdomen: Soft, gravid, appropriate for gestational age. Pain/Pressure: Present     Pelvic:  Cervical exam deferred        Extremities: Normal range of motion.  Edema: None  Mental Status: Normal mood and affect. Normal behavior. Normal judgment and thought content.   Assessment and Plan:  Pregnancy: G2P1001 at 2220w1d  1. Encounter for supervision of other normal pregnancy in third trimester Emphasized water intake.  - GC[facility]Chlam[facility] amp [facility] - Strep Gp B NAA  Term labor symptoms and general obstetric precautions including but not limited to vaginal bleeding, contractions, leaking of fluid and fetal movement were reviewed in detail with the patient. Please refer  to After Visit Summary for other counseling recommendations.  Return in about 1 week (around 03/26/2017).   Marylene LandKooistra, Seriah Brotzman Lorraine, CNM

## 2017-03-19 NOTE — Assessment & Plan Note (Signed)
   PRENATAL VISIT NOTE  Subjective:  Anna [name] is a 31 y.o. G2P1001 at 2360w1d being seen today for ongoing prenatal care.  She is currently monitored for the following issues for this low-risk pregnancy and has Anxiety and depression; Twin pregnancy with early intrauterine death of Twin B at 9 weeks, first trimester; Supervision of normal pregnancy; and Large subchorionic hemorrhage in first trimester on her problem list.  Patient reports no complaints.  Contractions: Not present. Vag. Bleeding: None.  Movement: Present. Denies leaking of fluid.   The following portions of the patient's history were reviewed and updated as appropriate: allergies, current medications, past family history, past medical history, past social history, past surgical history and problem list. Problem list updated.  Objective:   Vitals:   03/19/17 1516  BP: 133/79  Pulse: 73  Weight: 144 lb (65.3 kg)    Fetal Status: Fetal Heart Rate (bpm): 135 Fundal Height: 37 cm Movement: Present     General:  Alert, oriented and cooperative. Patient is in no acute distress.  Skin: Skin is warm and dry. No rash noted.   Cardiovascular: Normal heart rate noted  Respiratory: Normal respiratory effort, no problems with respiration noted  Abdomen: Soft, gravid, appropriate for gestational age. Pain/Pressure: Present     Pelvic:  Cervical exam deferred        Extremities: Normal range of motion.  Edema: None  Mental Status: Normal mood and affect. Normal behavior. Normal judgment and thought content.   Assessment and Plan:  Pregnancy: G2P1001 at 5760w1d  1. Encounter for supervision of other normal pregnancy in third trimester Emphasized water intake and normalcy of Braxton hicks contractions.  - GC[facility]lam[facility] amp [facility] - Strep Gp B NAA  Term labor symptoms and general obstetric precautions including but not limited to vaginal bleeding, contractions, leaking of fluid and fetal movement were  reviewed in detail with the patient. Please refer to After Visit Summary for other counseling recommendations.  Return in about 1 week (around 03/26/2017).   Marylene LandKooistra, Read Bonelli Lorraine, CNM

## 2017-03-21 LAB — GC/CHLAMYDIA PROBE AMP (~~LOC~~) NOT AT ARMC
Chlamydia: NEGATIVE
Neisseria Gonorrhea: NEGATIVE

## 2017-03-21 LAB — STREP GP B NAA: Strep Gp B NAA: NEGATIVE

## 2017-03-28 ENCOUNTER — Encounter: Payer: 59 | Admitting: Obstetrics and Gynecology

## 2017-04-04 ENCOUNTER — Ambulatory Visit (INDEPENDENT_AMBULATORY_CARE_PROVIDER_SITE_OTHER): Payer: 59 | Admitting: Obstetrics & Gynecology

## 2017-04-04 VITALS — BP 137/80 | HR 81 | Wt 146.0 lb

## 2017-04-04 DIAGNOSIS — Z3483 Encounter for supervision of other normal pregnancy, third trimester: Secondary | ICD-10-CM

## 2017-04-04 NOTE — Progress Notes (Signed)
   PRENATAL VISIT NOTE  Subjective:  Anna [name] is a 31 y.o. G2P1001 at 7337w3d being seen today for ongoing prenatal care.  She is currently monitored for the following issues for this low-risk pregnancy and has Anxiety and depression; Twin pregnancy with early intrauterine death of Twin B at 9 weeks, first trimester; Supervision of normal pregnancy; and Large subchorionic hemorrhage in first trimester on her problem list.  Patient reports no complaints.   .  .   . Denies leaking of fluid.   The following portions of the patient's history were reviewed and updated as appropriate: allergies, current medications, past family history, past medical history, past social history, past surgical history and problem list. Problem list updated.  Objective:  There were no vitals filed for this visit.  Fetal Status:           General:  Alert, oriented and cooperative. Patient is in no acute distress.  Skin: Skin is warm and dry. No rash noted.   Cardiovascular: Normal heart rate noted  Respiratory: Normal respiratory effort, no problems with respiration noted  Abdomen: Soft, gravid, appropriate for gestational age.       Pelvic:  Cervical exam performed        Extremities: Normal range of motion.     Mental Status: Normal mood and affect. Normal behavior. Normal judgment and thought content.   Assessment and Plan:  Pregnancy: G2P1001 at 1637w3d  1. Encounter for supervision of other normal pregnancy in third trimester   Term labor symptoms and general obstetric precautions including but not limited to vaginal bleeding, contractions, leaking of fluid and fetal movement were reviewed in detail with the patient. Please refer to After Visit Summary for other counseling recommendations.  Return in about 1 week (around 04/11/2017).   Allie BossierMyra C Jodette Wik, MD

## 2017-04-04 NOTE — Progress Notes (Signed)
   PRENATAL VISIT NOTE  Subjective:  Anna CommonsJudith Hanigan is a 31 y.o. G2P1001 at 531w3d being seen today for ongoing prenatal care.  She is currently monitored for the following issues for this low-risk pregnancy and has Anxiety and depression; Twin pregnancy with early intrauterine death of Twin B at 9 weeks, first trimester; Supervision of normal pregnancy; and Large subchorionic hemorrhage in first trimester on her problem list.  Patient reports no complaints.  Contractions: Not present. Vag. Bleeding: None.  Movement: Present. Denies leaking of fluid.   The following portions of the patient's history were reviewed and updated as appropriate: allergies, current medications, past family history, past medical history, past social history, past surgical history and problem list. Problem list updated.  Objective:   Vitals:   04/04/17 1032  BP: 137/80  Pulse: 81  Weight: 146 lb (66.2 kg)    Fetal Status: Fetal Heart Rate (bpm): 130   Movement: Present     General:  Alert, oriented and cooperative. Patient is in no acute distress.  Skin: Skin is warm and dry. No rash noted.   Cardiovascular: Normal heart rate noted  Respiratory: Normal respiratory effort, no problems with respiration noted  Abdomen: Soft, gravid, appropriate for gestational age. Pain/Pressure: Present     Pelvic:  Cervical exam performed        Extremities: Normal range of motion.  Edema: None  Mental Status: Normal mood and affect. Normal behavior. Normal judgment and thought content.   Assessment and Plan:  Pregnancy: G2P1001 at 4331w3d  1. Encounter for supervision of other normal pregnancy in third trimester   Term labor symptoms and general obstetric precautions including but not limited to vaginal bleeding, contractions, leaking of fluid and fetal movement were reviewed in detail with the patient. Please refer to After Visit Summary for other counseling recommendations.  Return in about 1 week (around  04/11/2017).   Allie BossierMyra C Sharan Mcenaney, MD

## 2017-04-11 ENCOUNTER — Ambulatory Visit (INDEPENDENT_AMBULATORY_CARE_PROVIDER_SITE_OTHER): Payer: 59 | Admitting: Obstetrics and Gynecology

## 2017-04-11 VITALS — BP 121/79 | HR 80 | Wt 148.0 lb

## 2017-04-11 DIAGNOSIS — Z3483 Encounter for supervision of other normal pregnancy, third trimester: Secondary | ICD-10-CM

## 2017-04-11 NOTE — Progress Notes (Signed)
Prenatal Visit Note Date: 04/11/2017 Clinic: [facility]  Subjective:  Shela CommonsJudith Zinda is a 31 y.o. G2P1001 at 7858w3d being seen today for ongoing prenatal care.  She is currently monitored for the following issues for this low-risk pregnancy and has Anxiety and depression; Twin pregnancy with early intrauterine death of Twin B at 9 weeks, first trimester; Supervision of normal pregnancy; and Large subchorionic hemorrhage in first trimester on her problem list.  Patient reports no complaints.   Contractions: Not present. Vag. Bleeding: None.  Movement: Present. Denies leaking of fluid.   The following portions of the patient's history were reviewed and updated as appropriate: allergies, current medications, past family history, past medical history, past social history, past surgical history and problem list. Problem list updated.  Objective:   Vitals:   04/11/17 0833  BP: 121/79  Pulse: 80  Weight: 148 lb (67.1 kg)    Fetal Status: Fetal Heart Rate (bpm): 143 Fundal Height: 38 cm Movement: Present  Presentation: Vertex  General:  Alert, oriented and cooperative. Patient is in no acute distress.  Skin: Skin is warm and dry. No rash noted.   Cardiovascular: Normal heart rate noted  Respiratory: Normal respiratory effort, no problems with respiration noted  Abdomen: Soft, gravid, appropriate for gestational age. Pain/Pressure: Absent     Pelvic:  Cervical exam performed Dilation: 2.5 Effacement (%): 50 Station: -1 (pt request)  Extremities: Normal range of motion.  Edema: Trace  Mental Status: Normal mood and affect. Normal behavior. Normal judgment and thought content.   Urinalysis:      Assessment and Plan:  Pregnancy: G2P1001 at 7258w3d  1. Encounter for supervision of other normal pregnancy in third trimester Routine care. Set up PDIOL nv.  Term labor symptoms and general obstetric precautions including but not limited to vaginal bleeding, contractions, leaking  of fluid and fetal movement were reviewed in detail with the patient. Please refer to After Visit Summary for other counseling recommendations.  Return in about 5 days (around 04/16/2017) for 5-7d rob.   [place] BingPickens, Demari Gales, MD

## 2017-04-17 ENCOUNTER — Telehealth (HOSPITAL_COMMUNITY): Payer: Self-pay | Admitting: *Deleted

## 2017-04-17 ENCOUNTER — Ambulatory Visit (INDEPENDENT_AMBULATORY_CARE_PROVIDER_SITE_OTHER): Payer: 59 | Admitting: Obstetrics and Gynecology

## 2017-04-17 VITALS — BP 125/88 | HR 87 | Wt 147.0 lb

## 2017-04-17 DIAGNOSIS — Z3483 Encounter for supervision of other normal pregnancy, third trimester: Secondary | ICD-10-CM

## 2017-04-17 NOTE — Progress Notes (Signed)
Prenatal Visit Note Date: 04/17/2017 Clinic: [facility]  Subjective:  Anna CommonsJudith Sandora is a 31 y.o. G2P1001 at 3186w2d being seen today for ongoing prenatal care.  She is currently monitored for the following issues for this low-risk pregnancy and has Anxiety and depression; Twin pregnancy with early intrauterine death of Twin B at 9 weeks, first trimester; Supervision of normal pregnancy; and Large subchorionic hemorrhage in first trimester on her problem list.  Patient reports no complaints.   Contractions: Not present. Vag. Bleeding: None.  Movement: Present. Denies leaking of fluid.   The following portions of the patient's history were reviewed and updated as appropriate: allergies, current medications, past family history, past medical history, past social history, past surgical history and problem list. Problem list updated.  Objective:   Vitals:   04/17/17 1002  BP: 125/88  Pulse: 87  Weight: 147 lb (66.7 kg)    Fetal Status: Fetal Heart Rate (bpm): 135 Fundal Height: 39 cm Movement: Present  Presentation: Vertex  General:  Alert, oriented and cooperative. Patient is in no acute distress.  Skin: Skin is warm and dry. No rash noted.   Cardiovascular: Normal heart rate noted  Respiratory: Normal respiratory effort, no problems with respiration noted  Abdomen: Soft, gravid, appropriate for gestational age. Pain/Pressure: Present     Pelvic:  Cervical exam performed Dilation: 3 Effacement (%): 50 Station: -1  Extremities: Normal range of motion.  Edema: Trace  Mental Status: Normal mood and affect. Normal behavior. Normal judgment and thought content.   Urinalysis:      Assessment and Plan:  Pregnancy: G2P1001 at 4186w2d  1. Encounter for supervision of other normal pregnancy in third trimester Routine care. PDIOL set up  Term labor symptoms and general obstetric precautions including but not limited to vaginal bleeding, contractions, leaking of fluid and fetal  movement were reviewed in detail with the patient. Please refer to After Visit Summary for other counseling recommendations.  Return in about 5 weeks (around 05/22/2017).   [place] BingPickens, Kandon Hosking, MD

## 2017-04-17 NOTE — Progress Notes (Signed)
Induction scheduled 04/21/17 at 2345

## 2017-04-17 NOTE — Telephone Encounter (Signed)
Preadmission screen  

## 2017-04-21 ENCOUNTER — Other Ambulatory Visit: Payer: Self-pay | Admitting: Obstetrics and Gynecology

## 2017-04-22 ENCOUNTER — Inpatient Hospital Stay (HOSPITAL_COMMUNITY)
Admission: RE | Admit: 2017-04-22 | Discharge: 2017-04-24 | DRG: 767 | Disposition: A | Discharge status: Home / Self Care | Payer: 59 | Source: Ambulatory Visit | Attending: Obstetrics and Gynecology | Admitting: Obstetrics and Gynecology

## 2017-04-22 ENCOUNTER — Inpatient Hospital Stay (HOSPITAL_COMMUNITY): Payer: 59 | Admitting: Anesthesiology

## 2017-04-22 ENCOUNTER — Encounter (HOSPITAL_COMMUNITY): Payer: Self-pay

## 2017-04-22 DIAGNOSIS — O3121X2 Continuing pregnancy after intrauterine death of one fetus or more, first trimester, fetus 2: Secondary | ICD-10-CM | POA: Diagnosis not present

## 2017-04-22 DIAGNOSIS — Z8759 Personal history of other complications of pregnancy, childbirth and the puerperium: Secondary | ICD-10-CM

## 2017-04-22 DIAGNOSIS — O480 Post-term pregnancy: Secondary | ICD-10-CM | POA: Diagnosis not present

## 2017-04-22 DIAGNOSIS — O9081 Anemia of the puerperium: Secondary | ICD-10-CM | POA: Diagnosis not present

## 2017-04-22 DIAGNOSIS — Z3A41 41 weeks gestation of pregnancy: Secondary | ICD-10-CM | POA: Diagnosis not present

## 2017-04-22 DIAGNOSIS — D62 Acute posthemorrhagic anemia: Secondary | ICD-10-CM | POA: Diagnosis not present

## 2017-04-22 DIAGNOSIS — Z3493 Encounter for supervision of normal pregnancy, unspecified, third trimester: Secondary | ICD-10-CM

## 2017-04-22 HISTORY — DX: Other hemorrhage in early pregnancy: O20.8

## 2017-04-22 HISTORY — DX: Other specified disorders of amniotic fluid and membranes, first trimester, not applicable or unspecified: O41.8X10

## 2017-04-22 HISTORY — DX: Other antepartum hemorrhage, first trimester: O46.8X1

## 2017-04-22 LAB — CBC
HCT: 33.3 % — ABNORMAL LOW (ref 36.0–46.0)
Hemoglobin: 11.1 g/dL — ABNORMAL LOW (ref 12.0–15.0)
MCH: 27.8 pg (ref 26.0–34.0)
MCHC: 33.3 g/dL (ref 30.0–36.0)
MCV: 83.5 fL (ref 78.0–100.0)
Platelets: 175 10*3/uL (ref 150–400)
RBC: 3.99 MIL/uL (ref 3.87–5.11)
RDW: 13.4 % (ref 11.5–15.5)
WBC: 11.7 10*3/uL — ABNORMAL HIGH (ref 4.0–10.5)

## 2017-04-22 LAB — RPR: RPR Ser Ql: NONREACTIVE

## 2017-04-22 MED ORDER — ACETAMINOPHEN 325 MG PO TABS: 650.0000 mg | ORAL_TABLET | ORAL | Status: DC | PRN

## 2017-04-22 MED ORDER — SIMETHICONE 80 MG PO CHEW
80.0000 mg | CHEWABLE_TABLET | ORAL | Status: DC | PRN
Start: 2017-04-22 — End: 2017-04-24

## 2017-04-22 MED ORDER — OXYTOCIN 40 UNITS IN LACTATED RINGERS INFUSION - SIMPLE MED: 1.0000 m[IU]/min | INTRAVENOUS | Status: DC

## 2017-04-22 MED ORDER — DIBUCAINE 1 % RE OINT: 1.0000 "application " | TOPICAL_OINTMENT | RECTAL | Status: DC | PRN

## 2017-04-22 MED ORDER — ONDANSETRON HCL 4 MG/2ML IJ SOLN
4.0000 mg | Freq: Four times a day (QID) | INTRAMUSCULAR | Status: DC | PRN
  Filled 2017-04-22: qty 2

## 2017-04-22 MED ORDER — TETANUS-DIPHTH-ACELL PERTUSSIS 5-2.5-18.5 LF-MCG/0.5 IM SUSP
0.5000 mL | Freq: Once | INTRAMUSCULAR | Status: AC
  Administered 2017-04-23: 0.5 mL via INTRAMUSCULAR
  Filled 2017-04-22: qty 0.5

## 2017-04-22 MED ORDER — OXYCODONE-ACETAMINOPHEN 5-325 MG PO TABS: 2.0000 | ORAL_TABLET | ORAL | Status: DC | PRN

## 2017-04-22 MED ORDER — MISOPROSTOL 200 MCG PO TABS
800.0000 ug | ORAL_TABLET | Freq: Once | ORAL | Status: AC
  Administered 2017-04-22: 800 ug via RECTAL

## 2017-04-22 MED ORDER — OXYTOCIN 40 UNITS IN LACTATED RINGERS INFUSION - SIMPLE MED
1.0000 m[IU]/min | INTRAVENOUS | Status: DC
  Administered 2017-04-22: 2 m[IU]/min via INTRAVENOUS

## 2017-04-22 MED ORDER — FENTANYL CITRATE (PF) 100 MCG/2ML IJ SOLN: 100.0000 ug | INTRAMUSCULAR | Status: DC | PRN

## 2017-04-22 MED ORDER — DIPHENHYDRAMINE HCL 50 MG/ML IJ SOLN: 12.5000 mg | INTRAMUSCULAR | Status: DC | PRN

## 2017-04-22 MED ORDER — SENNOSIDES-DOCUSATE SODIUM 8.6-50 MG PO TABS
2.0000 | ORAL_TABLET | ORAL | Status: DC
  Administered 2017-04-23 (×2): 2 via ORAL
  Filled 2017-04-22 (×2): qty 2

## 2017-04-22 MED ORDER — OXYCODONE-ACETAMINOPHEN 5-325 MG PO TABS: 1.0000 | ORAL_TABLET | ORAL | Status: DC | PRN

## 2017-04-22 MED ORDER — IBUPROFEN 600 MG PO TABS
600.0000 mg | ORAL_TABLET | Freq: Four times a day (QID) | ORAL | Status: DC
  Administered 2017-04-22 – 2017-04-24 (×9): 600 mg via ORAL
  Filled 2017-04-22 (×9): qty 1

## 2017-04-22 MED ORDER — LIDOCAINE HCL (PF) 1 % IJ SOLN
30.0000 mL | INTRAMUSCULAR | Status: DC | PRN
  Filled 2017-04-22: qty 30

## 2017-04-22 MED ORDER — TERBUTALINE SULFATE 1 MG/ML IJ SOLN
0.2500 mg | Freq: Once | INTRAMUSCULAR | Status: DC | PRN
  Filled 2017-04-22: qty 1

## 2017-04-22 MED ORDER — PHENYLEPHRINE 40 MCG/ML (10ML) SYRINGE FOR IV PUSH (FOR BLOOD PRESSURE SUPPORT)
80.0000 ug | PREFILLED_SYRINGE | INTRAVENOUS | Status: DC | PRN
  Filled 2017-04-22 (×2): qty 10
  Filled 2017-04-22: qty 5

## 2017-04-22 MED ORDER — ONDANSETRON HCL 4 MG/2ML IJ SOLN
4.0000 mg | Freq: Four times a day (QID) | INTRAMUSCULAR | Status: DC | PRN
  Administered 2017-04-22: 4 mg via INTRAVENOUS

## 2017-04-22 MED ORDER — CEFAZOLIN SODIUM-DEXTROSE 2-4 GM/100ML-% IV SOLN
2.0000 g | Freq: Once | INTRAVENOUS | Status: AC
  Administered 2017-04-22: 2 g via INTRAVENOUS

## 2017-04-22 MED ORDER — ZOLPIDEM TARTRATE 5 MG PO TABS
5.0000 mg | ORAL_TABLET | Freq: Every evening | ORAL | Status: DC | PRN
Start: 2017-04-22 — End: 2017-04-24

## 2017-04-22 MED ORDER — ONDANSETRON HCL 4 MG PO TABS
4.0000 mg | ORAL_TABLET | ORAL | Status: DC | PRN
Start: 2017-04-22 — End: 2017-04-24

## 2017-04-22 MED ORDER — MISOPROSTOL 50MCG HALF TABLET
50.0000 ug | ORAL_TABLET | ORAL | Status: DC | PRN
  Administered 2017-04-22: 50 ug via ORAL
  Filled 2017-04-22: qty 1

## 2017-04-22 MED ORDER — ONDANSETRON HCL 4 MG/2ML IJ SOLN: 4.0000 mg | INTRAMUSCULAR | Status: DC | PRN

## 2017-04-22 MED ORDER — LACTATED RINGERS IV SOLN
INTRAVENOUS | Status: DC
  Administered 2017-04-22 (×2): via INTRAVENOUS

## 2017-04-22 MED ORDER — LACTATED RINGERS IV SOLN: INTRAVENOUS | Status: DC

## 2017-04-22 MED ORDER — MEASLES, MUMPS & RUBELLA VAC ~~LOC~~ INJ
0.5000 mL | INJECTION | Freq: Once | SUBCUTANEOUS | Status: AC
  Administered 2017-04-23: 0.5 mL via SUBCUTANEOUS
  Filled 2017-04-22: qty 0.5

## 2017-04-22 MED ORDER — LACTATED RINGERS IV SOLN
500.0000 mL | INTRAVENOUS | Status: DC | PRN
  Administered 2017-04-22: 500 mL via INTRAVENOUS

## 2017-04-22 MED ORDER — EPHEDRINE 5 MG/ML INJ
10.0000 mg | INTRAVENOUS | Status: DC | PRN
  Filled 2017-04-22: qty 2

## 2017-04-22 MED ORDER — OXYTOCIN 40 UNITS IN LACTATED RINGERS INFUSION - SIMPLE MED
2.5000 [IU]/h | INTRAVENOUS | Status: DC
  Filled 2017-04-22: qty 1000

## 2017-04-22 MED ORDER — LACTATED RINGERS IV SOLN: 500.0000 mL | INTRAVENOUS | Status: DC | PRN

## 2017-04-22 MED ORDER — PHENYLEPHRINE 40 MCG/ML (10ML) SYRINGE FOR IV PUSH (FOR BLOOD PRESSURE SUPPORT)
80.0000 ug | PREFILLED_SYRINGE | INTRAVENOUS | Status: DC | PRN
  Administered 2017-04-22: 80 ug via INTRAVENOUS
  Filled 2017-04-22: qty 5

## 2017-04-22 MED ORDER — BUPIVACAINE-EPINEPHRINE (PF) 0.25% -1:200000 IJ SOLN
INTRAMUSCULAR | Status: DC | PRN
  Administered 2017-04-22: 10 mL

## 2017-04-22 MED ORDER — OXYTOCIN BOLUS FROM INFUSION: 500.0000 mL | Freq: Once | INTRAVENOUS | Status: DC

## 2017-04-22 MED ORDER — OXYTOCIN BOLUS FROM INFUSION
500.0000 mL | Freq: Once | INTRAVENOUS | Status: AC
  Administered 2017-04-22: 500 mL via INTRAVENOUS

## 2017-04-22 MED ORDER — LIDOCAINE HCL (PF) 1 % IJ SOLN
30.0000 mL | INTRAMUSCULAR | Status: AC | PRN
  Administered 2017-04-22: 30 mL via SUBCUTANEOUS
  Filled 2017-04-22: qty 30

## 2017-04-22 MED ORDER — FENTANYL CITRATE (PF) 100 MCG/2ML IJ SOLN: 50.0000 ug | INTRAMUSCULAR | Status: DC | PRN

## 2017-04-22 MED ORDER — SOD CITRATE-CITRIC ACID 500-334 MG/5ML PO SOLN: 30.0000 mL | ORAL | Status: DC | PRN

## 2017-04-22 MED ORDER — OXYTOCIN 40 UNITS IN LACTATED RINGERS INFUSION - SIMPLE MED: 2.5000 [IU]/h | INTRAVENOUS | Status: DC

## 2017-04-22 MED ORDER — DIPHENHYDRAMINE HCL 25 MG PO CAPS: 25.0000 mg | ORAL_CAPSULE | Freq: Four times a day (QID) | ORAL | Status: DC | PRN

## 2017-04-22 MED ORDER — MISOPROSTOL 200 MCG PO TABS
ORAL_TABLET | ORAL | Status: AC
  Filled 2017-04-22: qty 4

## 2017-04-22 MED ORDER — BENZOCAINE-MENTHOL 20-0.5 % EX AERO
1.0000 "application " | INHALATION_SPRAY | CUTANEOUS | Status: DC | PRN
  Administered 2017-04-24: 1 via TOPICAL
  Filled 2017-04-22 (×2): qty 56

## 2017-04-22 MED ORDER — LACTATED RINGERS IV SOLN
500.0000 mL | Freq: Once | INTRAVENOUS | Status: AC
Start: 2017-04-22 — End: 2017-04-22
  Administered 2017-04-22: 500 mL via INTRAVENOUS

## 2017-04-22 MED ORDER — METHYLERGONOVINE MALEATE 0.2 MG/ML IJ SOLN
INTRAMUSCULAR | Status: AC
  Administered 2017-04-22: 0.2 mg
  Filled 2017-04-22: qty 1

## 2017-04-22 MED ORDER — COCONUT OIL OIL: 1.0000 "application " | TOPICAL_OIL | Status: DC | PRN

## 2017-04-22 MED ORDER — LIDOCAINE HCL (PF) 1 % IJ SOLN
INTRAMUSCULAR | Status: DC | PRN
  Administered 2017-04-22: 13 mL via EPIDURAL

## 2017-04-22 MED ORDER — PRENATAL MULTIVITAMIN CH
1.0000 | ORAL_TABLET | Freq: Every day | ORAL | Status: DC
  Administered 2017-04-23 – 2017-04-24 (×2): 1 via ORAL
  Filled 2017-04-22 (×2): qty 1

## 2017-04-22 MED ORDER — FENTANYL 2.5 MCG/ML BUPIVACAINE 1/10 % EPIDURAL INFUSION (WH - ANES)
14.0000 mL/h | INTRAMUSCULAR | Status: DC | PRN
  Administered 2017-04-22: 14 mL/h via EPIDURAL
  Filled 2017-04-22: qty 100

## 2017-04-22 MED ORDER — WITCH HAZEL-GLYCERIN EX PADS: 1.0000 "application " | MEDICATED_PAD | CUTANEOUS | Status: DC | PRN

## 2017-04-22 NOTE — Anesthesia Procedure Notes (Signed)
Epidural Patient location during procedure: OB Start time: 04/22/2017 7:56 AM End time: 04/22/2017 8:10 AM  Staffing Anesthesiologist: Anitra LauthMILLER, Larkin Morelos RAY Performed: anesthesiologist   Preanesthetic Checklist Completed: patient identified, site marked, surgical consent, pre-op evaluation, timeout performed, IV checked, risks and benefits discussed and monitors and equipment checked  Epidural Patient position: sitting Prep: ChloraPrep Patient monitoring: heart rate, cardiac monitor, continuous pulse ox and blood pressure Approach: midline Location: L2-L3 Injection technique: LOR saline  Needle:  Needle type: Tuohy  Needle gauge: 17 G Needle length: 9 cm Needle insertion depth: 5 cm Catheter type: closed end flexible Catheter size: 20 Guage Catheter at skin depth: 8 cm Test dose: negative  Assessment Events: blood not aspirated, injection not painful, no injection resistance, negative IV test and no paresthesia  Additional Notes Reason for block:procedure for pain

## 2017-04-22 NOTE — Anesthesia Pain Management Evaluation Note (Signed)
  CRNA Pain Management Visit Note  Patient: Anna [name], 31 y.o., female  "Hello [facility] am [facility]. We have an anesthesia team available at all times to provide care throughout the hospital, including epidural management and anesthesia for C-section. I don't know your plan for the delivery whether it a natural birth, water birth, IV sedation, nitrous supplementation, doula or epidural, but we want to meet your pain goals."   1.Was your pain managed to your expectations on prior hospitalizations?   Yes   2.What is your expectation for pain management during this hospitalization?     Epidural  3.How can we help you reach that goal? Epidural in and working to patient's satisfaction  Record the patient's initial score and the patient's pain goal.   Pain: 6  Pain Goal: 6 [facility] wants you to be able to say your pain was always managed very well.  Anna [name],Anna [name] 04/22/2017

## 2017-04-22 NOTE — Progress Notes (Signed)
Patient ID: Anna [name], female   DOB: Nov 29, 1985, 31 y.o.   MRN: [id number]  S: Patient seen & examined for progress of labor. Patient comfortable asleep in the room.    O:  Vitals:   04/22/17 0301 04/22/17 0400 04/22/17 0500 04/22/17 0555  BP: 122/76 117/73 117/75   Pulse: 73 65 64   Resp: 16   16  Temp:    97.8 F (36.6 C)  TempSrc:    Oral  Weight:      Height:        Dilation: 3 Effacement (%): 50 Cervical Position: Middle Station: -3 Presentation: Vertex Exam by:: Dr.Hairo Garraway   FHT: 135bpm, mod var, +accels, no decels TOCO: q4-428min   A/P: Switching from cytotec to pitocin Continue expectant management Anticipate SVD

## 2017-04-22 NOTE — H&P (Signed)
LABOR AND DELIVERY ADMISSION HISTORY AND PHYSICAL NOTE  Earlyn Sylvan is a 31 y.o. female G2P1001 with IUP at [redacted]w[redacted]d by LMP c/w 6 week [place] presenting for PDIOL.   She reports positive fetal movement. She denies leakage of fluid or vaginal bleeding.  Prenatal History/Complications:  Twin B demise at 9 weeks 2/2 subchorionic hemorrhage  Past Medical History: Past Medical History:  Diagnosis Date  . Acne 06/15/2015  . Anxiety and depression 06/15/2015    Past Surgical History: No past surgical history on file.  Obstetrical History: OB History    Gravida Para Term Preterm AB Living   2 1 1  0 0 1   SAB TAB Ectopic Multiple Live Births   0 0 0 0 1      Social History: Social History   Social History  . Marital status: Married    Spouse name: N/A  . Number of children: N/A  . Years of education: N/A   Social History Main Topics  . Smoking status: Never Smoker  . Smokeless tobacco: Never Used  . Alcohol use 0.0 oz/week     Comment: occasional  . Drug use: No  . Sexual activity: Yes    Birth control/ protection: None   Other Topics Concern  . Not on file   Social History Narrative  . No narrative on file    Family History: Family History  Problem Relation Age of Onset  . Other Neg Hx     Allergies: Allergies  Allergen Reactions  . Lexapro [Escitalopram] Rash    Prescriptions Prior to Admission  Medication Sig Dispense Refill Last Dose  . prenatal vitamin w/FE, FA (NATACHEW) 29-1 MG CHEW chewable tablet Chew 1 tablet by mouth daily at 12 noon.   Taking     Review of Systems   All systems reviewed and negative except as stated in HPI  Physical Exam Last menstrual period 07/09/2016. General appearance: alert, cooperative, appears stated age and no distress Lungs: clear to auscultation bilaterally Heart: regular rate and rhythm Abdomen: soft, non-tender; bowel sounds normal Extremities: No calf swelling or tenderness Presentation: cephalic Fetal  monitoring: 130 bpm, mod var, +accels, no decels Uterine activity: Rare   2.5-3/60/-3, posterior, moderate thickness, vertex presentation  Prenatal labs: ABO, Rh: O/POS/-- (10/31 1712) Antibody: NEG (10/31 1712) Rubella: !Error! IMMUNE RPR: Non Reactive (03/09 0832)  HBsAg: NEGATIVE (10/31 1712)  HIV: Non Reactive (03/09 1610)  GBS: Negative (05/09 1526)  2 hr Glucola: normal, 64/95/89 Genetic screening:  Was not done due to twins Anatomy US: Normal   Prenatal Transfer Tool  Maternal Diabetes: No Genetic Screening: Declined Maternal Ultrasounds/Referrals: Normal Fetal Ultrasounds or other Referrals:  None Maternal Substance Abuse:  No Significant Maternal Medications:  None Significant Maternal Lab Results: Lab values include: Group B Strep negative  No results found for this or any previous visit (from the past 24 hour(s)).  Patient Active Problem List   Diagnosis Date Noted  . Personal history of previous postdates pregnancy 04/22/2017  . Supervision of normal pregnancy in third trimester 04/22/2017  . Twin pregnancy with early intrauterine death of Twin B at 9 weeks, first trimester 09/10/2016  . Supervision of normal pregnancy 09/10/2016  . Large subchorionic hemorrhage in first trimester 09/10/2016  . Anxiety and depression 06/15/2015    Assessment: Alvera Tourigny is a 31 y.o. G2P1001 at [redacted]w[redacted]d here for PDIOL  #Labor: Cytotec PO to start #Pain: Epidural on request #FWB: Cat I #ID:  GBS Neg #MOF: Breast #MOC:OCPs #Circ:  Inpatient  Jen MowElizabeth Stacie Templin, DO OB Fellow [facility] 04/22/2017, 12:55 AM

## 2017-04-22 NOTE — Anesthesia Preprocedure Evaluation (Signed)

## 2017-04-22 NOTE — Lactation Note (Signed)
This note was copied from a baby's chart. Lactation Consultation Note  Patient Name: Anna [name] Shela CommonsJudith Mutz WGNFA'OToday's Date: 04/22/2017 Reason for consult: Initial assessment Baby at 4 hr of life. Upon entry baby was latched to the R breast in cradle position. Mom denies breast or nipple pain, parents voiced no concerns. 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 Discussed baby behavior, feeding frequency, baby belly size, voids, wt loss, breast changes, and nipple care. Mom stated she can manually express and has spoon in the room. Given lactation handouts. Aware of OP services and support group.    Maternal Data Has patient been taught Hand Expression?: Yes Does the patient have breastfeeding experience prior to this delivery?: No  Feeding Feeding Type: Breast Fed Length of feed: 15 min  LATCH Score/Interventions Latch: Grasps breast easily, tongue down, lips flanged, rhythmical sucking. (per mom)  Audible Swallowing: A few with stimulation Intervention(s): Skin to skin Intervention(s): Hand expression  Type of Nipple: Everted at rest and after stimulation  Comfort (Breast/Nipple): Soft / non-tender     Hold (Positioning): No assistance needed to correctly position infant at breast. (per mom) Intervention(s): Support Pillows  LATCH Score: 9  Lactation Tools Discussed/Used     Consult Status Consult Status: Follow-up Date: 04/23/17 Follow-up type: In-patient    Rulon Eisenmengerlizabeth E Aryahna Spagna 04/22/2017, 4:45 PM

## 2017-04-23 LAB — CBC WITH DIFFERENTIAL/PLATELET
Basophils Absolute: 0 10*3/uL (ref 0.0–0.1)
Basophils Relative: 0 %
Eosinophils Absolute: 0.1 10*3/uL (ref 0.0–0.7)
Eosinophils Relative: 1 %
HCT: 31.8 % — ABNORMAL LOW (ref 36.0–46.0)
Hemoglobin: 10.4 g/dL — ABNORMAL LOW (ref 12.0–15.0)
Lymphocytes Relative: 15 %
Lymphs Abs: 2.2 10*3/uL (ref 0.7–4.0)
MCH: 28.4 pg (ref 26.0–34.0)
MCHC: 32.7 g/dL (ref 30.0–36.0)
MCV: 86.9 fL (ref 78.0–100.0)
Monocytes Absolute: 0.7 10*3/uL (ref 0.1–1.0)
Monocytes Relative: 5 %
Neutro Abs: 12.1 10*3/uL — ABNORMAL HIGH (ref 1.7–7.7)
Neutrophils Relative %: 80 %
Platelets: 193 10*3/uL (ref 150–400)
RBC: 3.66 MIL/uL — ABNORMAL LOW (ref 3.87–5.11)
RDW: 13.7 % (ref 11.5–15.5)
WBC: 15.1 10*3/uL — ABNORMAL HIGH (ref 4.0–10.5)

## 2017-04-23 LAB — PREPARE RBC (CROSSMATCH)

## 2017-04-23 LAB — CBC
HCT: 24.9 % — ABNORMAL LOW (ref 36.0–46.0)
Hemoglobin: 7.9 g/dL — ABNORMAL LOW (ref 12.0–15.0)
MCH: 27.9 pg (ref 26.0–34.0)
MCHC: 31.7 g/dL (ref 30.0–36.0)
MCV: 88 fL (ref 78.0–100.0)
Platelets: 169 10*3/uL (ref 150–400)
RBC: 2.83 MIL/uL — ABNORMAL LOW (ref 3.87–5.11)
RDW: 13.3 % (ref 11.5–15.5)
WBC: 14.5 10*3/uL — ABNORMAL HIGH (ref 4.0–10.5)

## 2017-04-23 MED ORDER — SODIUM CHLORIDE 0.9 % IV SOLN: Freq: Once | INTRAVENOUS | Status: DC

## 2017-04-23 NOTE — Lactation Note (Signed)
This note was copied from a baby's chart. Lactation Consultation Note  Patient Name: Anna [name]: 04/23/2017 Reason for consult: Follow-up assessment  Follow up visit at 34 hours of age. Mom reports some soreness with latch on and then more comfort during feeding. Mom reports baby pulls back and doesn't stay deeply latched.  Baby had a recent feeding and continues to show feeding cues.  Mom is able to hand express small drops.  Mom latches baby in cradle hold to right breast.  Baby opens wide, but curls lower lip in. Lc instructed mom on how to uncurl lip or work on wide open mouth with quicker latching.  Baby has strong sucking bursts with some audible swallows.  Nipple round and evert when baby released nipple.  Mom to call as needed for assist.      Maternal Data    Feeding Feeding Type: Breast Fed Length of feed: 25 min  LATCH Score/Interventions Latch: Repeated attempts needed to sustain latch, nipple held in mouth throughout feeding, stimulation needed to elicit sucking reflex. Intervention(s): Adjust position;Assist with latch;Breast massage;Breast compression  Audible Swallowing: A few with stimulation Intervention(s): Hand expression  Type of Nipple: Everted at rest and after stimulation  Comfort (Breast/Nipple): Soft / non-tender     Hold (Positioning): Assistance needed to correctly position infant at breast and maintain latch.  LATCH Score: 7  Lactation Tools Discussed/Used     Consult Status Consult Status: Follow-up [name]: 04/24/17 Follow-up type: In-patient    Jessenia Filippone, Arvella MerlesJana Lynn 04/23/2017, 10:50 PM

## 2017-04-23 NOTE — Progress Notes (Signed)
Post Partum Day #1 Subjective: voiding, tolerating PO and reports normal lochia now, dizzy with walking, would like a transfusion of PRBCs.  Objective: Blood pressure (!) 91/52, pulse 68, temperature 97.5 F (36.4 C), temperature source Oral, resp. rate 18, height 5\' 4"  (1.626 m), weight 67.1 kg (148 lb), last menstrual period 07/09/2016, SpO2 100 %, unknown if currently breastfeeding.  Physical Exam:  General: alert Lochia: appropriate Uterine Fundus: firm and NT at U DVT Evaluation: No evidence of DVT seen on physical exam.   Recent Labs  04/22/17 0110 04/23/17 0530  HGB 11.1* 7.9*  HCT 33.3* 24.9*    Assessment/Plan: Plan for discharge tomorrow  3 units of PRBCs today CBC in the AM   LOS: 1 day   Brandalyn Harting C Elenna Spratling 04/23/2017, 6:50 AM

## 2017-04-23 NOTE — Anesthesia Postprocedure Evaluation (Signed)
Anesthesia Post Note  Patient: Shela CommonsJudith List  Procedure(s) Performed: * No procedures listed *     Patient location during evaluation: Mother Baby Anesthesia Type: Epidural Level of consciousness: awake Pain management: pain level controlled Vital Signs Assessment: post-procedure vital signs reviewed and stable Respiratory status: spontaneous breathing Cardiovascular status: stable Postop Assessment: no headache, no backache, epidural receding, patient able to bend at knees, no signs of nausea or vomiting and adequate PO intake Anesthetic complications: no    Last Vitals:  Vitals:   04/22/17 2100 04/23/17 0525  BP: 124/69 (!) 91/52  Pulse: 89 68  Resp: 18 18  Temp: 37.3 C 36.4 C    Last Pain:  Vitals:   04/23/17 0525  TempSrc: Oral  PainSc: 2    Pain Goal: Patients Stated Pain Goal: 2 (04/22/17 1105)               Britlyn Martine

## 2017-04-23 NOTE — Progress Notes (Signed)
MOB was referred for history of depression/anxiety. * Referral screened out by Clinical Social Worker because none of the following criteria appear to apply: ~ History of anxiety/depression during this pregnancy, or of post-partum depression. ~ Diagnosis of anxiety and/or depression within last 3 years OR * MOB's symptoms currently being treated with medication and/or therapy.  MOB reported MOB is an established patient with Dr. Evelene CroonKaur and MOB was diagnosed as a teenager. CSW provided education regarding Baby Blues vs PMADs and provided MOB with information about [facility] groups [facility].  CSW encouraged MOB to evaluate her mental health throughout the postpartum period with the use of the New Mom Checklist developed by Postpartum Progress and notify a medical professional if symptoms arise.    Please contact the Clinical Social Worker if needs arise, or if MOB requests.  Blaine HamperAngel Boyd-Gilyard, MSW, LCSW Clinical Social Work [phone]

## 2017-04-24 ENCOUNTER — Encounter (HOSPITAL_COMMUNITY): Payer: Self-pay

## 2017-04-24 LAB — CBC
HCT: 28.4 % — ABNORMAL LOW (ref 36.0–46.0)
Hemoglobin: 9.6 g/dL — ABNORMAL LOW (ref 12.0–15.0)
MCH: 29.3 pg (ref 26.0–34.0)
MCHC: 33.8 g/dL (ref 30.0–36.0)
MCV: 86.6 fL (ref 78.0–100.0)
Platelets: 170 10*3/uL (ref 150–400)
RBC: 3.28 MIL/uL — ABNORMAL LOW (ref 3.87–5.11)
RDW: 14 % (ref 11.5–15.5)
WBC: 12.8 10*3/uL — ABNORMAL HIGH (ref 4.0–10.5)

## 2017-04-24 MED ORDER — IBUPROFEN 600 MG PO TABS: 600.0000 mg | ORAL_TABLET | Freq: Four times a day (QID) | ORAL | 2 refills | Status: DC

## 2017-04-24 NOTE — Discharge Summary (Signed)
OB Discharge Summary    Patient Name: Anna [name] DOB: [birth year] MRN: [id number]  Date of admission: 04/22/2017 Delivering MD: Shonna Chock BEDFORD   Date of discharge: 04/24/2017  Admitting diagnosis: induction Intrauterine pregnancy: [redacted]w[redacted]d     Secondary diagnosis:  Active Problems:   Personal history of previous postdates pregnancy   Supervision of normal pregnancy in third trimester  Additional problems: Postdates      Discharge diagnosis: Term Pregnancy Delivered                                                                                            Post partum procedures: blood transfusion  Augmentation: Pitocin and Cytotec  Complications: pph  Hospital course:  Induction of Labor With Vaginal Delivery   31 y.o. yo [id number] at [redacted]w[redacted]d was admitted to the hospital 04/22/2017 for induction of labor.  Indication for induction: Postdates.  Patient had an uncomplicated labor course as follows: Membrane Rupture Time/Date: 9:45 AM ,04/22/2017   Intrapartum Procedures: Episiotomy: None [1]                                         Lacerations:  2nd degree [3];Perineal [11]  Patient had delivery of a Viable infant.  Information for the patient's newborn:  Jailine, Lieder [[id number]]  Delivery Method: Vag-Spont   04/22/2017  Details of delivery can be found in separate delivery note.  Patient had a routine postpartum course. Patient is discharged home 04/24/17.  Physical exam  Vitals:   04/23/17 1215 04/23/17 1350 04/23/17 1700 04/24/17 0602  BP: 113/76 126/84 123/71 114/68  Pulse: 73 87 82 75  Resp: 18 17 19 18   Temp: 97.5 F (36.4 C) 97.7 F (36.5 C) 98.7 F (37.1 C) 98.6 F (37 C)  TempSrc: Oral Axillary Oral Oral  SpO2: 98% 100% 97%   Weight:      Height:       General: alert, cooperative and no distress Lochia: appropriate Uterine Fundus: firm Incision: N/A DVT Evaluation: No evidence of DVT seen on physical exam.  Labs: Lab Results  Component Value  Date   WBC 12.8 (H) 04/24/2017   HGB 9.6 (L) 04/24/2017   HCT 28.4 (L) 04/24/2017   MCV 86.6 04/24/2017   PLT 170 04/24/2017   CMP Latest Ref Rng & Units 11/17/2012  Glucose 70 - 99 mg/dL 578(I)  BUN 6 - 23 mg/dL 18  Creatinine 6.96 - 2.95 mg/dL 2.84  Sodium 132 - 440 mEq/L 135  Potassium 3.5 - 5.1 mEq/L 3.3(L)  Chloride 96 - 112 mEq/L 98  CO2 19 - 32 mEq/L 22  Calcium 8.4 - 10.5 mg/dL 10.6(H)  Total Protein 6.0 - 8.3 g/dL 6.8  Total Bilirubin 0.3 - 1.2 mg/dL 1.0(U)  Alkaline Phos 39 - 117 U/L 60  AST 0 - 37 U/L 17  ALT 0 - 35 U/L 13    Discharge instruction: per After Visit Summary and "Baby and Me Booklet".  After visit meds:  Allergies as of 04/24/2017  Reactions   Lexapro [escitalopram] Rash      Medication List    STOP taking these medications   calcium carbonate 750 MG chewable tablet Commonly known as:  TUMS EX     TAKE these medications   acetaminophen 500 MG tablet Commonly known as:  TYLENOL Take 500 mg by mouth every 6 (six) hours as needed for moderate pain.   ibuprofen 600 MG tablet Commonly known as:  ADVIL,MOTRIN Take 1 tablet (600 mg total) by mouth every 6 (six) hours.   prenatal vitamin w/FE, FA 29-1 MG Chew chewable tablet Chew 1 tablet by mouth daily at 12 noon.     Diet: routine diet  Activity: Advance as tolerated. Pelvic rest for 6 weeks.   Outpatient follow up:6 weeks Follow up Appt: Future Appointments Date Time Provider Department Center  05/22/2017 11:00 AM Anyanwu, Jethro BastosUgonna A, MD [facility]   Follow up Visit:No Follow-up on file.  Postpartum contraception: Vasectomy  Newborn Data: Live born female  Birth Weight: 6 lb 14.4 oz (3130 g) APGAR: 8, 9  Baby Feeding: Breast Disposition:home with mother   04/24/2017 Thurnell LoseAlessandra G Brindy Higginbotham, Medical Student

## 2017-04-24 NOTE — Discharge Instructions (Signed)

## 2017-04-24 NOTE — Lactation Note (Signed)
This note was copied from a baby's chart. Lactation Consultation Note  Baby latched in cradle position while mother is standing. Demonstrated how to compress breast to keep baby active. Sucks and swallows observed. Mom encouraged to feed baby 8-12 times/24 hours and with feeding cues.  Reviewed engorgement care and monitoring voids/stools. Answered questions regarding pumping and going back to work and cluster feeding.  Patient Name: Anna [name]     Maternal Data    Feeding Feeding Type: Breast Fed  LATCH Score/Interventions Latch: Grasps breast easily, tongue down, lips flanged, rhythmical sucking. Intervention(s): Breast massage;Breast compression  Audible Swallowing: A few with stimulation Intervention(s): Hand expression Intervention(s): Hand expression  Type of Nipple: Everted at rest and after stimulation  Comfort (Breast/Nipple): Soft / non-tender     Hold (Positioning): No assistance needed to correctly position infant at breast. Intervention(s): Breastfeeding basics reviewed  LATCH Score: 9  Lactation Tools Discussed/Used     Consult Status      Dahlia ByesBerkelhammer, Kendrick [facility] [name], 10:57 AM

## 2017-04-26 LAB — BPAM RBC
Blood Product Expiration Date: 201806182359
Blood Product Expiration Date: 201807032359
Blood Product Expiration Date: 201807032359
ISSUE DATE / TIME: 201806130924
ISSUE DATE / TIME: 201806131153
Unit Type and Rh: 5100
Unit Type and Rh: 5100
Unit Type and Rh: 9500

## 2017-04-26 LAB — TYPE AND SCREEN
ABO/RH(D): O POS
Antibody Screen: NEGATIVE
Unit division: 0
Unit division: 0
Unit division: 0

## 2017-05-06 ENCOUNTER — Other Ambulatory Visit: Payer: 59 | Admitting: *Deleted

## 2017-05-06 DIAGNOSIS — R300 Dysuria: Secondary | ICD-10-CM | POA: Diagnosis not present

## 2017-05-06 MED ORDER — CEPHALEXIN 500 MG PO CAPS: 500.0000 mg | ORAL_CAPSULE | Freq: Three times a day (TID) | ORAL | 0 refills | Status: DC

## 2017-05-06 NOTE — Progress Notes (Signed)
Patient seen and assessed by nursing staff.  Agree with documentation and plan.  

## 2017-05-06 NOTE — Addendum Note (Signed)
Addended by: Arne ClevelandHUTCHINSON, Zanovia Rotz J on: 05/06/2017 04:52 PM   Modules accepted: Orders

## 2017-05-06 NOTE — Progress Notes (Signed)
SUBJECTIVE: Shela CommonsJudith Verner is a 31 y.o. female who complains of urinary frequency, urgency and dysuria x 4 days, without flank pain, fever, chills, or abnormal vaginal discharge or bleeding. She is 2 weeks postpartum and breastfeeding.   OBJECTIVE: Appears well, in no apparent distress.  Vital signs are normal. Urine dipstick shows positive for WBC's and positive for RBC's.    ASSESSMENT: Dysuria  PLAN: Treatment per orders.  Call or return to clinic prn if these symptoms worsen or fail to improve as anticipated.

## 2017-05-08 ENCOUNTER — Telehealth: Payer: Self-pay | Admitting: *Deleted

## 2017-05-08 NOTE — Telephone Encounter (Signed)
Pt called in and LM on nurse line stating she would like a call back in regard to the abx that was sent in for UTI.

## 2017-05-10 LAB — CULTURE, URINE COMPREHENSIVE

## 2017-05-19 ENCOUNTER — Encounter (INDEPENDENT_AMBULATORY_CARE_PROVIDER_SITE_OTHER): Payer: Self-pay | Admitting: *Deleted

## 2017-05-19 DIAGNOSIS — Z029 Encounter for administrative examinations, unspecified: Secondary | ICD-10-CM

## 2017-05-22 ENCOUNTER — Encounter: Payer: Self-pay | Admitting: Obstetrics & Gynecology

## 2017-05-22 ENCOUNTER — Ambulatory Visit (INDEPENDENT_AMBULATORY_CARE_PROVIDER_SITE_OTHER): Payer: 59 | Admitting: Obstetrics & Gynecology

## 2017-05-22 DIAGNOSIS — Z1151 Encounter for screening for human papillomavirus (HPV): Secondary | ICD-10-CM

## 2017-05-22 DIAGNOSIS — Z124 Encounter for screening for malignant neoplasm of cervix: Secondary | ICD-10-CM

## 2017-05-22 NOTE — Progress Notes (Signed)
Post Partum Exam  Anna [name] is a 31 y.o. 702P2002 female who presents for a postpartum visit. She is 4 weeks postpartum following a spontaneous vaginal delivery. I have fully reviewed the prenatal and intrapartum course. The delivery was at 41 gestational weeks.  Anesthesia: epidural. Postpartum course has been unremarkable. Baby's course has been umremarkable. Baby is feeding by breast. Bleeding staining only. Bowel function is normal. Bladder function is normal. Patient is not sexually active. Contraception method is vasectomy. Postpartum depression screening:neg  The following portions of the patient's history were reviewed and updated as appropriate: allergies, current medications, past family history, past medical history, past social history, past surgical history and problem list.  Review of Systems Pertinent items noted in HPI and remainder of comprehensive ROS otherwise negative.    Objective:  Blood pressure 127/81, pulse 88, resp. rate 18, height 5\' 4"  (1.626 m), weight 134 lb (60.8 kg), currently breastfeeding.  General:  alert and no distress   Breasts:  inspection negative, no nipple discharge or bleeding, no masses or nodularity palpable  Lungs: clear to auscultation bilaterally  Heart:  regular rate and rhythm  Abdomen: soft, non-tender; bowel sounds normal; no masses,  no organomegaly   Vulva:  normal, laceration healing well, sutures visible  Vagina: normal vagina, no discharge, exudate, lesion, or erythema  Cervix:  multiparous appearance, no bleeding following Pap, no cervical motion tenderness and no lesions  Corpus: normal size, contour, position, consistency, mobility, non-tender  Adnexa:  normal adnexa and no mass, fullness, tenderness  Rectal Exam: Not performed.        Assessment:    Normal postpartum exam. Pap smear done at today's visit.   Plan:   1. Contraception: vasectomy 2. Pelvic rest advised for at least 2 more weeks. 3. Follow up as needed.    Anna CollinsUGONNA  Anna Ignatowski, MD, FACOG Attending Obstetrician & Gynecologist, [facility], [facility]

## 2017-05-22 NOTE — Patient Instructions (Signed)
Return to clinic for any scheduled appointments or for any gynecologic concerns as needed.   

## 2017-05-22 NOTE — Addendum Note (Signed)
Addended by: Gita KudoLASSITER, Maximilliano Kersh S on: 05/22/2017 11:47 AM   Modules accepted: Orders

## 2017-05-26 LAB — CYTOLOGY - PAP
Diagnosis: NEGATIVE
HPV: NOT DETECTED

## 2017-07-16 ENCOUNTER — Telehealth: Payer: Self-pay | Admitting: *Deleted

## 2017-07-16 DIAGNOSIS — N61 Mastitis without abscess: Secondary | ICD-10-CM

## 2017-07-16 MED ORDER — CEPHALEXIN 500 MG PO CAPS: 500.0000 mg | ORAL_CAPSULE | Freq: Three times a day (TID) | ORAL | 0 refills | Status: DC

## 2017-07-16 NOTE — Telephone Encounter (Signed)
Pt called in stating she has had Left Breast pain that she describes as "sharp shooting" pain. She states this pain has been ongoing for 5 days.  Before breast pain started she felt body aches but unsure if she had fever. She states body aches are better now but breast pain is has not subsided.  She has tried warm compresses, warm showers, without relief.  Advised pt we would send in Keflex for mastitis and recommended Lecitin and black sunflower oil to help thin milk which will help unclog duct. Pt expressed understanding.

## 2017-08-12 ENCOUNTER — Encounter: Payer: Self-pay | Admitting: Internal Medicine

## 2017-08-12 ENCOUNTER — Telehealth: Payer: Self-pay | Admitting: Internal Medicine

## 2017-08-12 ENCOUNTER — Ambulatory Visit (INDEPENDENT_AMBULATORY_CARE_PROVIDER_SITE_OTHER): Payer: 59 | Admitting: Internal Medicine

## 2017-08-12 VITALS — BP 118/80 | HR 74 | Temp 98.7°F | Wt 130.0 lb

## 2017-08-12 DIAGNOSIS — J0110 Acute frontal sinusitis, unspecified: Secondary | ICD-10-CM

## 2017-08-12 MED ORDER — AZITHROMYCIN 250 MG PO TABS: ORAL_TABLET | ORAL | 0 refills | Status: DC

## 2017-08-12 NOTE — Telephone Encounter (Signed)
[facility] Day - Client TELEPHONE ADVICE RECORD [facility]  Patient Name: Anna [name]  DOB: 12/14/1985    Initial Comment Caller has been having cold symptoms since friday, has dark brown/green mucus    Nurse Assessment  Nurse: Odis Luster, RN, Bjorn Loser Date/Time (Eastern Time): 08/12/2017 8:31:33 AM  Confirm and document reason for call. If symptomatic, describe symptoms. ---Caller has been having cold symptoms since friday, has dark brown/green mucus that she is coughing up. Denies fever.  Does the patient have any new or worsening symptoms? ---Yes  Will a triage be completed? ---Yes  Related visit to physician within the last 2 weeks? ---No  Does the PT have any chronic conditions? (i.e. diabetes, asthma, etc.) ---No  Is the patient pregnant or possibly pregnant? (Ask all females between the ages of 59-55) ---No  Is this a behavioral health or substance abuse call? ---No     Guidelines    Guideline Title Affirmed Question Affirmed Notes  Cough - Acute Productive Coughing up rusty-colored (reddish-brown) sputum    Final Disposition User   See Physician within 24 Hours Odis Luster, RN, Bjorn Loser    Comments  Scheduled appt for today at the [facility] location with Nicki Reaper at 11:30am.   Referrals  REFERRED TO PCP OFFICE   Caller Disagree/Comply Comply  Caller Understands Yes  PreDisposition Did not know what to do

## 2017-08-12 NOTE — Progress Notes (Signed)
HPI  Pt presents to the clinic today with c/o headache, facial pain and pressure, ear fullness, nasal congestion, sore throat and cough. She reports this started 4 days ago. She is blowing yellow mucous out of her nose. She denies ear pain or decreased hearing. She denies difficulty swallowing. The cough is productive of brown/green mucous. She denies fever, chills or body aches. She has tried salt water gargles, Tylenol, Ibuprofen and Vicks with minimal relief. She has no history of allergies. She has had sick contacts.  Review of Systems     Past Medical History:  Diagnosis Date  . Acne 06/15/2015  . Anxiety   . Anxiety and depression 06/15/2015  . Subchorionic hemorrhage in first trimester     Family History  Problem Relation Age of Onset  . Other Neg Hx     Social History   Social History  . Marital status: Married    Spouse name: N/A  . Number of children: N/A  . Years of education: N/A   Occupational History  . Not on file.   Social History Main Topics  . Smoking status: Never Smoker  . Smokeless tobacco: Never Used  . Alcohol use 0.0 oz/week     Comment: occasional  . Drug use: No  . Sexual activity: Not Currently    Birth control/ protection: None, Surgical     Comment: spouse had vasectomy   Other Topics Concern  . Not on file   Social History Narrative  . No narrative on file    Allergies  Allergen Reactions  . Lexapro [Escitalopram] Rash     Constitutional: Positive headache. Denies fatigue, fever or abrupt weight changes.  HEENT:  Positive facial pain, nasal congestion and sore throat. Denies eye redness, ear pain, ringing in the ears, wax buildup, runny nose or bloody nose. Respiratory: Positive cough. Denies difficulty breathing or shortness of breath.  Cardiovascular: Denies chest pain, chest tightness, palpitations or swelling in the hands or feet.   No other specific complaints in a complete review of systems (except as listed in HPI  above).  Objective:  BP 118/80   Pulse 74   Temp 98.7 F (37.1 C) (Oral)   Wt 130 lb (59 kg)   SpO2 98%   BMI 22.31 kg/m    General: Appears her stated age, ill appearing, in NAD. HEENT: Head: normal shape and size, mild frontal sinus tenderness noted;  Ears: Tm's gray and intact, normal light reflex; Nose: mucosa boggy and moist, septum midline; Throat/Mouth: + PND. Teeth present, mucosa erythematous and moist, no exudate noted, no lesions or ulcerations noted.  Neck:  No adenopathy noted.  Cardiovascular: Normal rate and rhythm. S1,S2 noted.  No murmur, rubs or gallops noted.  Pulmonary/Chest: Normal effort and positive vesicular breath sounds. No respiratory distress. No wheezes, rales or ronchi noted.       Assessment & Plan:   Acute bacterial sinusitis  Can use a Neti Pot which can be purchased from your local drug store. Flonase 2 sprays each nostril for 3 days and then as needed. eRx for Azithromax daily  for 5 days  RTC as needed or if symptoms persist. Nicki Reaper, NP

## 2017-08-12 NOTE — Patient Instructions (Signed)
Upper Respiratory Infection, Adult Most upper respiratory infections (URIs) are caused by a virus. A URI affects the nose, throat, and upper air passages. The most common type of URI is often called "the common cold." Follow these instructions at home:  Take medicines only as told by your doctor.  Gargle warm saltwater or take cough drops to comfort your throat as told by your doctor.  Use a warm mist humidifier or inhale steam from a shower to increase air moisture. This may make it easier to breathe.  Drink enough fluid to keep your pee (urine) clear or pale yellow.  Eat soups and other clear broths.  Have a healthy diet.  Rest as needed.  Go back to work when your fever is gone or your doctor says it is okay. ? You may need to stay home longer to avoid giving your URI to others. ? You can also wear a face mask and wash your hands often to prevent spread of the virus.  Use your inhaler more if you have asthma.  Do not use any tobacco products, including cigarettes, chewing tobacco, or electronic cigarettes. If you need help quitting, ask your doctor. Contact a doctor if:  You are getting worse, not better.  Your symptoms are not helped by medicine.  You have chills.  You are getting more short of breath.  You have brown or red mucus.  You have yellow or brown discharge from your nose.  You have pain in your face, especially when you bend forward.  You have a fever.  You have puffy (swollen) neck glands.  You have pain while swallowing.  You have white areas in the back of your throat. Get help right away if:  You have very bad or constant: ? Headache. ? Ear pain. ? Pain in your forehead, behind your eyes, and over your cheekbones (sinus pain). ? Chest pain.  You have long-lasting (chronic) lung disease and any of the following: ? Wheezing. ? Long-lasting cough. ? Coughing up blood. ? A change in your usual mucus.  You have a stiff neck.  You have  changes in your: ? Vision. ? Hearing. ? Thinking. ? Mood. This information is not intended to replace advice given to you by your health care provider. Make sure you discuss any questions you have with your health care provider. Document Released: 04/15/2008 Document Revised: 06/30/2016 Document Reviewed: 02/02/2014 Elsevier Interactive Patient Education  2018 [employer]vier Inc.  

## 2017-08-12 NOTE — Telephone Encounter (Signed)
Pt is scheduled to see R Baity NP 08/12/17 at 11:30.

## 2017-09-15 ENCOUNTER — Encounter (INDEPENDENT_AMBULATORY_CARE_PROVIDER_SITE_OTHER): Payer: Self-pay | Admitting: Ophthalmology

## 2017-09-20 ENCOUNTER — Ambulatory Visit (HOSPITAL_COMMUNITY)
Admission: EM | Admit: 2017-09-20 | Discharge: 2017-09-20 | Disposition: A | Discharge status: Home / Self Care | Payer: 59 | Attending: Emergency Medicine | Admitting: Emergency Medicine

## 2017-09-20 ENCOUNTER — Other Ambulatory Visit: Payer: Self-pay

## 2017-09-20 ENCOUNTER — Encounter (HOSPITAL_COMMUNITY): Payer: Self-pay | Admitting: Emergency Medicine

## 2017-09-20 DIAGNOSIS — N61 Mastitis without abscess: Secondary | ICD-10-CM | POA: Diagnosis not present

## 2017-09-20 DIAGNOSIS — N644 Mastodynia: Secondary | ICD-10-CM

## 2017-09-20 MED ORDER — CEPHALEXIN 250 MG PO CAPS: 250.0000 mg | ORAL_CAPSULE | Freq: Four times a day (QID) | ORAL | 0 refills | Status: DC

## 2017-09-20 NOTE — ED Triage Notes (Signed)
Pain started last week.  Bilateral breast pain.  Patient has had mastitis.  Patient is certain this is what is going on now.  Patient is currently breast feeding

## 2017-09-20 NOTE — ED Provider Notes (Signed)
[facility]    CSN: [id number] Arrival date & time: 09/20/17  1457     History   Chief Complaint Chief Complaint  Patient presents with  . Breast Pain    HPI Anna CommonsJudith [name] is a 31 y.o. female.   31 year old female is approximately 2 months postpartum and breast-feeding. She states she has been feeling malaise, achiness, weakness and unusual breast pain that is different from breast engorgement. She had a similar incident a couple months ago was diagnosed with mastitis and treated with cephalexin. She is requesting same treatment.      Past Medical History:  Diagnosis Date  . Acne 06/15/2015  . Anxiety   . Anxiety and depression 06/15/2015  . Subchorionic hemorrhage in first trimester     There are no active problems to display for this patient.   History reviewed. No pertinent surgical history.  OB History    Gravida Para Term Preterm AB Living   2 2 2  0 0 2   SAB TAB Ectopic Multiple Live Births   0 0 0 0 2       Home Medications    Prior to Admission medications   Medication Sig Start Date End Date Taking? Authorizing Provider  cephALEXin (KEFLEX) 250 MG capsule Take 1 capsule (250 mg total) 4 (four) times daily by mouth. 09/20/17   Hayden RasmussenMabe, Lysha Schrade, NP    Family History Family History  Problem Relation Age of Onset  . Other Neg Hx     Social History Social History   Tobacco Use  . Smoking status: Never Smoker  . Smokeless tobacco: Never Used  Substance Use Topics  . Alcohol use: Yes    Alcohol/week: 0.0 oz    Comment: occasional  . Drug use: No     Allergies   Lexapro [escitalopram]   Review of Systems Review of Systems  Constitutional: Positive for activity change. Negative for fever.  HENT: Negative.   Skin: Negative.   Neurological: Negative.   All other systems reviewed and are negative.    Physical Exam Triage Vital Signs ED Triage Vitals  Enc Vitals Group     BP 09/20/17 1529 126/70     Pulse Rate 09/20/17 1529 74      Resp 09/20/17 1529 18     Temp 09/20/17 1529 98.1 F (36.7 C)     Temp Source 09/20/17 1529 Oral     SpO2 09/20/17 1529 100 %     Weight --      Height --      Head Circumference --      Peak Flow --      Pain Score 09/20/17 1528 5     Pain Loc --      Pain Edu? --      Excl. in GC? --    No data found.  Updated Vital Signs BP 126/70 (BP Location: Left Arm)   Pulse 74   Temp 98.1 F (36.7 C) (Oral)   Resp 18   SpO2 100%   Visual Acuity Right Eye Distance:   Left Eye Distance:   Bilateral Distance:    Right Eye Near:   Left Eye Near:    Bilateral Near:     Physical Exam  Constitutional: She is oriented to person, place, and time. She appears well-developed and well-nourished. No distress.  Eyes: EOM are normal.  Neck: Normal range of motion. Neck supple.  Cardiovascular: Normal rate.  Pulmonary/Chest: Effort normal. No respiratory distress.  Breast exam:  Ladona Ridgelaylor, RN present/chaperone. Examination of bilateral breast reveals left breast little larger than the right. There is more thickening and firmness to the left breast. No erythema. No lymphangitis. No evidence of cellulitis. No change in color or shape of the areola or nipples. No increase in warmth. Both breasts are soft of a right greater than left, no evidence of over engorgement.  Musculoskeletal: She exhibits no edema.  Neurological: She is alert and oriented to person, place, and time. She exhibits normal muscle tone.  Skin: Skin is warm and dry.  Psychiatric: She has a normal mood and affect.  Nursing note and vitals reviewed.    UC Treatments / Results  Labs (all labs ordered are listed, but only abnormal results are displayed) Labs Reviewed - No data to display  EKG  EKG Interpretation None       Radiology No results found.  Procedures Procedures (including critical care time)  Medications Ordered in UC Medications - No data to display   Initial Impression / Assessment and Plan /  UC Course  I have reviewed the triage vital signs and the nursing notes.  Pertinent labs & imaging results that were available during my care of the patient were reviewed by me and considered in my medical decision making (see chart for details).     Patient is certain that she has mastitis. Not much clinical evidence to support this. She is rather insistent regarding treatment. She is pleasant and non-argumentative.  Final Clinical Impressions(s) / UC Diagnoses   Final diagnoses:  Breast pain  Mastitis    ED Discharge Orders        Ordered    cephALEXin (KEFLEX) 250 MG capsule  4 times daily     09/20/17 1649       Controlled Substance Prescriptions [place] Controlled Substance Registry consulted? Not Applicable   Hayden RasmussenMabe, Aairah Negrette, NP 09/20/17 1655

## 2017-10-14 ENCOUNTER — Encounter: Payer: Self-pay | Admitting: Obstetrics & Gynecology

## 2017-10-14 ENCOUNTER — Encounter: Payer: Self-pay | Admitting: Radiology

## 2017-10-14 ENCOUNTER — Ambulatory Visit: Payer: 59 | Admitting: Obstetrics & Gynecology

## 2017-10-14 VITALS — BP 107/76 | HR 150 | Temp 98.1°F | Wt 126.2 lb

## 2017-10-14 DIAGNOSIS — N61 Mastitis without abscess: Secondary | ICD-10-CM

## 2017-10-14 MED ORDER — FLUCONAZOLE 150 MG PO TABS
150.0000 mg | ORAL_TABLET | Freq: Once | ORAL | 3 refills | Status: AC
Start: 2017-10-14 — End: 2017-10-14

## 2017-10-14 MED ORDER — SULFAMETHOXAZOLE-TRIMETHOPRIM 800-160 MG PO TABS: 1.0000 | ORAL_TABLET | Freq: Two times a day (BID) | ORAL | 1 refills | Status: DC

## 2017-10-14 NOTE — Progress Notes (Signed)
RGYN pt presents for problem visit pain in left breast since Sunday evening. Pt states left breast is warm to touch 6/10 pain. Pt is Breastfeeding.

## 2017-10-14 NOTE — Progress Notes (Signed)
   GYNECOLOGY OFFICE VISIT NOTE  History:  31 y.o. [id number] here today for evaluation of possible mastitis; reports she has left breast pain that has been getting more severe. Started on Sunday 10/12/17. No fevers, no purulent drainage.  Still breastfeeding. Was treated for similar symptoms last month with Keflex.  She denies any abnormal vaginal discharge, bleeding, pelvic pain or other concerns.   Past Medical History:  Diagnosis Date  . Acne 06/15/2015  . Anxiety   . Anxiety and depression 06/15/2015  . Subchorionic hemorrhage in first trimester     History reviewed. No pertinent surgical history.  The following portions of the patient's history were reviewed and updated as appropriate: allergies, current medications, past family history, past medical history, past social history, past surgical history and problem list.   Health Maintenance:  Normal pap and negative HRHPV on 05/22/2017.   Review of Systems:  Pertinent items noted in HPI and remainder of comprehensive ROS otherwise negative.  Objective:  Physical Exam BP 107/76   Pulse (!) 150   Temp 98.1 F (36.7 C) (Oral)   Wt 126 lb 3.2 oz (57.2 kg)   Breastfeeding? Yes   BMI 21.66 kg/m  BP 107/76   Pulse (!) 150   Temp 98.1 F (36.7 C) (Oral)   Wt 126 lb 3.2 oz (57.2 kg)   Breastfeeding? Yes   BMI 21.66 kg/m  CONSTITUTIONAL: Well-developed, well-nourished female in no acute distress.  SKIN: Skin is warm and dry. No rash noted. Not diaphoretic. No erythema. No pallor. BREASTS: Symmetric in size. No erythema but left breast is very warm to touch. No masses, skin changes, nipple drainage, or lymphadenopathy bilaterally.   Labs and Imaging No results found.  Assessment & Plan:  1. Mastitis Will treat with Bactrim this time, follow up response. Encouraged to take NSAIDs as needed. - sulfamethoxazole-trimethoprim (BACTRIM DS,SEPTRA DS) 800-160 MG tablet; Take 1 tablet by mouth 2 (two) times daily.  Dispense: 14 tablet;  Refill: 1 - In case of rebound yeast infection; Fluconazole (DIFLUCAN) 150 MG tablet; Take 1 tablet (150 mg total) by mouth once for 1 dose. Can take additional dose three days later if symptoms persist  Dispense: 1 tablet; Refill: 3  Routine preventative health maintenance measures emphasized. Please refer to After Visit Summary for other counseling recommendations.   Return in about 1 week (around 10/21/2017), or if symptoms worsen or fail to improve, for Followup mastitis.   Total face-to-face time with patient: 15 minutes.  Over 50% of encounter was spent on counseling and coordination of care.   Jaynie CollinsUGONNA  Malvin Morrish, MD, FACOG Attending Obstetrician & Gynecologist, [facility], [facility]

## 2017-10-14 NOTE — Patient Instructions (Signed)
Breastfeeding and Mastitis  Mastitis is inflammation of the breast tissue. It can occur in women who are breastfeeding. This can make breastfeeding painful. Mastitis will sometimes go away on its own, especially if it is not caused by an infection (non-infectious mastitis). Your health care provider will help determine if medical treatment is needed. Treatment may be needed if the condition is caused by a bacterial infection (infectious mastitis).  What are the causes?  This condition is often associated with a blocked milkduct, which can happen when too much milk builds up in the breast. Causes of excess milk in the breast can include:  · Poor latch-on. If your baby is not latched onto the breast properly, he or she may not empty your breast completely while breastfeeding.  · Allowing too much time to pass between feedings.  · Wearing a bra or other clothing that is too tight. This puts extra pressure on the milk ducts so milk does not flow through them as it should.  · Milk remaining in the breast because it is overfilled (engorged).  · Stress and fatigue.    Mastitis can also be caused by a bacterial infection. Bacteria may enter the breast tissue through cuts, cracks, or openings in the skin near the nipple area. Cracks in the skin are often caused when your baby does not latch on properly to the breast.  What are the signs or symptoms?  Symptoms of this condition include:  · Swelling, redness, tenderness, and pain in an area of the breast. This usually affects the upper part of the breast, toward the armpit region. In most cases, it affects only one breast. In some cases, it may occur on both breasts at the same time and affect a larger portion of breast tissue.  · Swelling of the glands under the arm on the same side.  · Fatigue, headache, and flu-like muscle aches.  · Fever.  · Rapid pulse.    Symptoms usually last 2 to 5 days. Breast pain and redness are at their worst on day 2 and day 3, and they usually go  away by day 5. If an infection is left to progress, a collection of pus (abscess) may develop.  How is this diagnosed?  This condition can be diagnosed based on your symptoms and a physical exam. You may also have tests, such as:  · Blood tests to determine if your body is fighting a bacterial infection.  · Mammogram or ultrasound tests to rule out other problems or diseases.  · Fluid tests. If an abscess has developed, the fluid in the abscess may be removed with a needle. The fluid may be analyzed to determine if bacteria are present.  · Breast milk may be cultured and tested for bacteria.    How is this treated?  This condition will sometimes go away on its own. Your health care provider may choose to wait 24 hours after first seeing you to decide whether treatment is needed. If treatment is needed, it may include:  · Strategies to manage breastfeeding. This includes continuing to breastfeed or pump in order to allow adequate milk flow, using breast massage, and applying heat or cold to the affected area.  · Self-care such as rest and increased fluid intake.  · Medicine for pain.  · Antibiotic medicine to treat a bacterial infection. This is usually taken by mouth.  · If an abscess has developed, it may be treated by removing fluid with a needle.      Follow these instructions at home:  Medicines  · Take over-the-counter and prescription medicines only as told by your health care provider.  · If you were prescribed an antibiotic medicine, take it as told by your health care provider. Do not stop taking the antibiotic even if you start to feel better.  General instructions  · Do not wear a tight or underwire bra. Wear a soft, supportive bra.  · Increase your fluid intake, especially if you have a fever.  · Get plenty of rest.  For breastfeeding:  · Continue to empty your breasts as often as possible, either by breastfeeding or using an electric breast pump. This will lower the pressure and the pain that comes with  it. Ask your health care provider if changes need to be made to your breastfeeding or pumping routine.  · Keep your nipples clean and dry.  · During breastfeeding, empty the first breast completely before going to the other breast. If your baby is not emptying your breasts completely, use a breast pump to empty your breasts.  · Use breast massage during feeding or pumping sessions.  · If directed, apply moist heat to the affected area of your breast right before breastfeeding or pumping. Use the heat source that your health care provider recommends.  · If directed, put ice on the affected area of your breast right after breastfeeding or pumping:  ? Put ice in a plastic bag.  ? Place a towel between your skin and the bag.  ? Leave the ice on for 20 minutes.  · If you go back to work, pump your breasts while at work to stay in time with your nursing schedule.  · Do not allow your breasts to become engorged.  Contact a health care provider if:  · You have pus-like discharge from the breast.  · You have a fever.  · Your symptoms do not improve within 2 days of starting treatment.  · Your symptoms return after you have recovered from a breast infection.  Get help right away if:  · Your pain and swelling are getting worse.  · You have pain that is not controlled with medicine.  · You have a red line extending from the breast toward your armpit.  Summary  · Mastitis is inflammation of the breast tissue. It is often caused by a blocked milk duct or bacteria.  · This condition may be treated with hot and cold compresses, medicines, self-care, and certain breastfeeding strategies.  · If you were prescribed an antibiotic medicine, take it as told by your health care provider. Do not stop taking the antibiotic even if you start to feel better.  · Continue to empty your breasts as often as possible either by breastfeeding or using an electric breast pump.  This information is not intended to replace advice given to you by your  health care provider. Make sure you discuss any questions you have with your health care provider.  Document Released: 02/22/2005 Document Revised: 10/29/2016 Document Reviewed: 10/29/2016  Elsevier Interactive Patient Education © 2017 Elsevier Inc.

## 2017-10-20 ENCOUNTER — Ambulatory Visit (HOSPITAL_COMMUNITY)
Admission: EM | Admit: 2017-10-20 | Discharge: 2017-10-20 | Disposition: A | Discharge status: Home / Self Care | Payer: 59 | Attending: Family Medicine | Admitting: Family Medicine

## 2017-10-20 ENCOUNTER — Encounter (HOSPITAL_COMMUNITY): Payer: Self-pay | Admitting: Emergency Medicine

## 2017-10-20 DIAGNOSIS — H10023 Other mucopurulent conjunctivitis, bilateral: Secondary | ICD-10-CM

## 2017-10-20 MED ORDER — TOBRAMYCIN 0.3 % OP SOLN: 1.0000 [drp] | OPHTHALMIC | 0 refills | Status: DC

## 2017-10-20 NOTE — ED Triage Notes (Signed)
PT reports drainage, discomfort in both eyes. This started yesterday.

## 2017-10-20 NOTE — ED Provider Notes (Signed)
[facility]   [id number] 10/20/17 Arrival Time: 1237   SUBJECTIVE:  Shela CommonsJudith Mayo is a 31 y.o. female who presents to the urgent care with complaint of drainage, discomfort in both eyes. This started yesterday. Uses contact lenses.  Past Medical History:  Diagnosis Date  . Acne 06/15/2015  . Anxiety   . Anxiety and depression 06/15/2015  . Subchorionic hemorrhage in first trimester    Family History  Problem Relation Age of Onset  . Other Neg Hx    Social History   Socioeconomic History  . Marital status: Married    Spouse name: Not on file  . Number of children: Not on file  . Years of education: Not on file  . Highest education level: Not on file  Social Needs  . Financial resource strain: Not on file  . Food insecurity - worry: Not on file  . Food insecurity - inability: Not on file  . Transportation needs - medical: Not on file  . Transportation needs - non-medical: Not on file  Occupational History  . Not on file  Tobacco Use  . Smoking status: Never Smoker  . Smokeless tobacco: Never Used  Substance and Sexual Activity  . Alcohol use: No    Alcohol/week: 0.0 oz    Frequency: Never    Comment: occasional  . Drug use: No  . Sexual activity: Not Currently    Birth control/protection: None, Surgical    Comment: spouse had vasectomy  Other Topics Concern  . Not on file  Social History Narrative  . Not on file   No outpatient medications have been marked as taking for the 10/20/17 encounter [facility](Hospital Encounter).   Allergies  Allergen Reactions  . Lexapro [Escitalopram] Rash      ROS: As per HPI, remainder of ROS negative.   OBJECTIVE:   Vitals:   10/20/17 1303 10/20/17 1304  BP: 110/66   Pulse: 72   Resp: 16   Temp: 98.7 F (37.1 C)   SpO2: 98%   Weight:  126 lb (57.2 kg)  Height:  5\' 4"  (1.626 m)     General appearance: alert; no distress Eyes: PERRL; EOMI; conjunctiva reddened bilaterally HENT: normocephalic; atraumatic; TMs  normal, canal normal, external ears normal without trauma; nasal mucosa normal; oral mucosa normal Neck: supple Back: no CVA tenderness Extremities: no cyanosis or edema; symmetrical with no gross deformities Skin: warm and dry Neurologic: normal gait; grossly normal Psychological: alert and cooperative; normal mood and affect      Labs:  Results for orders placed or performed in visit on 05/22/17  Cytology - PAP  Result Value Ref Range   Adequacy      Satisfactory for evaluation  endocervical/transformation zone component PRESENT.   Diagnosis      NEGATIVE FOR INTRAEPITHELIAL LESIONS OR MALIGNANCY.   HPV NOT DETECTED    Material Submitted CervicoVaginal Pap [ThinPrep Imaged]    CYTOLOGY - PAP PAP RESULT     Labs Reviewed - No data to display  No results found.     ASSESSMENT & PLAN:  1. Other mucopurulent conjunctivitis of both eyes     Meds ordered this encounter  Medications  . tobramycin (TOBREX) 0.3 % ophthalmic solution    Sig: Place 1 drop into both eyes every 4 (four) hours.    Dispense:  5 mL    Refill:  0    Reviewed expectations re: course of current medical issues. Questions answered. Outlined signs and symptoms indicating need for more  acute intervention. Patient verbalized understanding. After Visit Summary given.    Procedures:      Elvina SidleLauenstein, Victory Dresden, MD 10/20/17 1343

## 2017-10-22 ENCOUNTER — Telehealth: Payer: Self-pay

## 2017-10-22 NOTE — Telephone Encounter (Signed)
Patient Name: Shela CommonsJUDITH Saintil Gender: Female DOB: [birth year] Age: [age] Y 11 M 28 D Return Phone Number: [phone] (Primary), [phone] (Secondary) Address: [place]: [place] [address] Client [facility] Physician Nicki ReaperBaity, Regina - NP Contact Type Call Who Is Calling Patient / Member / Family / Caregiver Call Type Triage / Clinical Relationship To Patient Self Return Phone Number [phone]) [phone] (Primary) Chief Complaint Eye Pus Or Discharge Reason for Call Symptomatic / Request for Health Information Initial Comment Caller states that she has symptoms of pink eye Translation No Nurse Assessment Nurse: Durwin NoraWinston, RN, Tresa EndoKelly Date/Time Lamount Cohen(Eastern Time): 10/19/2017 11:35:12 AM Confirm and document reason for call. If symptomatic, describe symptoms. ---Caller states she woke up with red eyes and lots of whitishyellow eye discharge. Her eyelashes were stuck together. Does the patient have any new or worsening symptoms? ---Yes Will a triage be completed? ---Yes Related visit to physician within the last 2 weeks? ---No Does the PT have any chronic conditions? (i.e. diabetes, asthma, etc.) ---No Is the patient pregnant or possibly pregnant? (Ask all females between the ages of 3612-55) ---No Is this a behavioral health or substance abuse call? ---No Guidelines Guideline Title Affirmed Question Affirmed Notes Nurse Date/Time (Eastern Time) Eye - Pus or Discharge MODERATE eye pain (e.g., interferes with normal activities) Durwin NoraWinston, RN, Tresa EndoKelly 10/19/2017 11:37:01 AM Disp. Time Lamount Cohen(Eastern Time) Disposition Final User 10/19/2017 11:39:39 AM See Physician within 4 Hours (or PCP triage) Yes Durwin NoraWinston, RN, Adine MaduraKelly Caller Disagree/Comply Comply Caller Understands Yes PLEASE NOTE: All timestamps contained within this report are represented as [place] Standard Time. CONFIDENTIALTY NOTICE: This fax  transmission is intended only for the addressee. It contains information that is legally privileged, confidential or otherwise protected from use or disclosure. If you are not the intended recipient, you are strictly prohibited from reviewing, disclosing, copying using or disseminating any of this information or taking any action in reliance on or regarding this information. If you have received this fax in error, please notify us immediately by telephone so that we can arrange for its return to us. Phone: [phone], Toll-Free: [phone], Fax: [phone] Page: 2 of 2 Call Id: [id number] PreDisposition Call Doctor Care Advice Given Per Guideline SEE PHYSICIAN WITHIN 4 HOURS (or PCP triage): * IF OFFICE WILL BE CLOSED AND NO PCP TRIAGE: You need to be seen within the next 3 or 4 hours. A nearby Urgent Care Center is often a good source of care. Another choice is to go to the ER. Go sooner if you become worse. CALL BACK IF: * You become worse. Referrals [facility] [place] [facility]

## 2017-10-22 NOTE — Telephone Encounter (Signed)
Patient was seen in urgent care on 10/20/17.

## 2017-10-23 ENCOUNTER — Encounter (INDEPENDENT_AMBULATORY_CARE_PROVIDER_SITE_OTHER): Payer: 59 | Admitting: Ophthalmology

## 2017-10-30 ENCOUNTER — Encounter (INDEPENDENT_AMBULATORY_CARE_PROVIDER_SITE_OTHER): Payer: 59 | Admitting: Ophthalmology

## 2017-11-21 ENCOUNTER — Encounter (INDEPENDENT_AMBULATORY_CARE_PROVIDER_SITE_OTHER): Payer: 59 | Admitting: Ophthalmology

## 2017-12-18 ENCOUNTER — Encounter: Payer: Self-pay | Admitting: Internal Medicine

## 2017-12-18 ENCOUNTER — Ambulatory Visit (INDEPENDENT_AMBULATORY_CARE_PROVIDER_SITE_OTHER): Payer: 59 | Admitting: Internal Medicine

## 2017-12-18 VITALS — BP 112/74 | HR 66 | Temp 98.2°F | Wt 127.0 lb

## 2017-12-18 DIAGNOSIS — Z9109 Other allergy status, other than to drugs and biological substances: Secondary | ICD-10-CM | POA: Diagnosis not present

## 2017-12-18 MED ORDER — PREDNISONE 10 MG PO TABS: ORAL_TABLET | ORAL | 0 refills | Status: DC

## 2017-12-19 ENCOUNTER — Telehealth: Payer: Self-pay

## 2017-12-19 ENCOUNTER — Encounter: Payer: Self-pay | Admitting: Internal Medicine

## 2017-12-19 NOTE — Patient Instructions (Signed)
Allergies An allergy is when your body reacts to a substance in a way that is not normal. An allergic reaction can happen after you:  Eat something.  Breathe in something.  Touch something.  You can be allergic to:  Things that are only around during certain seasons, like molds and pollens.  Foods.  Drugs.  Insects.  Animal dander.  What are the signs or symptoms?  Puffiness (swelling). This may happen on the lips, face, tongue, mouth, or throat.  Sneezing.  Coughing.  Breathing loudly (wheezing).  Stuffy nose.  Tingling in the mouth.  A rash.  Itching.  Itchy, red, puffy areas of skin (hives).  Watery eyes.  Throwing up (vomiting).  Watery poop (diarrhea).  Dizziness.  Feeling faint or fainting.  Trouble breathing or swallowing.  A tight feeling in the chest.  A fast heartbeat. How is this diagnosed? Allergies can be diagnosed with:  A medical and family history.  Skin tests.  Blood tests.  A food diary. A food diary is a record of all the foods, drinks, and symptoms you have each day.  The results of an elimination diet. This diet involves making sure not to eat certain foods and then seeing what happens when you start eating them again.  How is this treated? There is no cure for allergies, but allergic reactions can be treated with medicine. Severe reactions usually need to be treated at a hospital. How is this prevented? The best way to prevent an allergic reaction is to avoid the thing you are allergic to. Allergy shots and medicines can also help prevent reactions in some cases. This information is not intended to replace advice given to you by your health care provider. Make sure you discuss any questions you have with your health care provider. Document Released: 02/22/2013 Document Revised: 06/24/2016 Document Reviewed: 08/09/2014 Elsevier Interactive Patient Education  2018 Elsevier Inc.  

## 2017-12-19 NOTE — Progress Notes (Signed)
HPI  Pt presents to the clinic today with c/o nasal congestion and ear pain. She reports this started 1 month ago, but worsened 1 week ago. She is not blowing anything out of her nose. She describes the ear pain as pressure and popping. She denies headache, facial pain or pressure, sore throat or cough. She denies fever, chills or body aches. She has tried Tylenol, Ibuprofen, Flonase and Allegra with minimal relief. She has not had sick contacts.  Review of Systems     Past Medical History:  Diagnosis Date  . Acne 06/15/2015  . Anxiety   . Anxiety and depression 06/15/2015  . Subchorionic hemorrhage in first trimester     Family History  Problem Relation Age of Onset  . Other Neg Hx     Social History   Socioeconomic History  . Marital status: Married    Spouse name: Not on file  . Number of children: Not on file  . Years of education: Not on file  . Highest education level: Not on file  Social Needs  . Financial resource strain: Not on file  . Food insecurity - worry: Not on file  . Food insecurity - inability: Not on file  . Transportation needs - medical: Not on file  . Transportation needs - non-medical: Not on file  Occupational History  . Not on file  Tobacco Use  . Smoking status: Never Smoker  . Smokeless tobacco: Never Used  Substance and Sexual Activity  . Alcohol use: No    Alcohol/week: 0.0 oz    Frequency: Never    Comment: occasional  . Drug use: No  . Sexual activity: Not Currently    Birth control/protection: None, Surgical    Comment: spouse had vasectomy  Other Topics Concern  . Not on file  Social History Narrative  . Not on file    Allergies  Allergen Reactions  . Lexapro [Escitalopram] Rash     Constitutional: Denies headache, fatigue, fever or abrupt weight changes.  HEENT:  Positive nasal congestiont. Denies eye redness, ear pain, ringing in the ears, wax buildup, runny nose or sore throat. Respiratory:  Denies cough,  difficulty  breathing or shortness of breath.  Cardiovascular: Denies chest pain, chest tightness, palpitations or swelling in the hands or feet.   No other specific complaints in a complete review of systems (except as listed in HPI above).  Objective:   BP 112/74   Pulse 66   Temp 98.2 F (36.8 C) (Oral)   Wt 127 lb (57.6 kg)   LMP 12/15/2017   SpO2 98%   BMI 21.80 kg/m   General: Appears her stated age,  in NAD. HEENT: Head: normal shape and size, no sinus tenderness noted;  Ears: Tm's gray and intact, normal light reflex; Nose: mucosa pink and moist, septum midline; Throat/Mouth: Teeth present, mucosa pink and moist, no exudate noted, no lesions or ulcerations noted.  Neck:  No adenopathy noted.  Cardiovascular: Normal rate and rhythm. S1,S2 noted.  No murmur, rubs or gallops noted.  Pulmonary/Chest: Normal effort and positive vesicular breath sounds. No respiratory distress. No wheezes, rales or ronchi noted.       Assessment & Plan:   Allergies:  Can use a Neti Pot which can be purchased from your local drug store. Continue antihistamine, stop Flonase eRx for Pred Taper x 6 days If no improvement, consider referral for allergy testing.  RTC as needed or if symptoms persist. Nicki ReaperBAITY, Deneise Getty, NP

## 2017-12-19 NOTE — Telephone Encounter (Signed)
Copied from CRM [id number]. Topic: General - Other >> Dec 19, 2017  8:11 AM Cipriano BunkerLambe, Annette S wrote: Reason for CRM: patient was in yesterday and she forgot to ask for a doctor's note..  She asked if can be faxed to her office today.  Fax # [phone]

## 2017-12-19 NOTE — Telephone Encounter (Signed)
Letter faxed.

## 2017-12-25 ENCOUNTER — Encounter: Payer: Self-pay | Admitting: Internal Medicine

## 2017-12-25 DIAGNOSIS — Z9109 Other allergy status, other than to drugs and biological substances: Secondary | ICD-10-CM

## 2018-01-12 ENCOUNTER — Telehealth: Payer: Self-pay

## 2018-01-12 NOTE — Telephone Encounter (Signed)
PLEASE NOTE: All timestamps contained within this report are represented as [place] Standard Time. CONFIDENTIALTY NOTICE: This fax transmission is intended only for the addressee. It contains information that is legally privileged, confidential or otherwise protected from use or disclosure. If you are not the intended recipient, you are strictly prohibited from reviewing, disclosing, copying using or disseminating any of this information or taking any action in reliance on or regarding this information. If you have received this fax in error, please notify us immediately by telephone so that we can arrange for its return to us. Phone: [phone], Toll-Free: [phone], Fax: [phone] Page: 1 of 3 Call Id: [id number] [facility] Client TELEPHONE ADVICE RECORD [facility] Patient Name: Anna [name] Gender: Female DOB: [birth year] Age: [age] Y 2 M 20 D Return Phone Number: [phone] (Primary), [phone] (Secondary) Address: City[place]: [place] [id number] Client [facility] - [facility] Night Physician Nicki ReaperBaity, Regina - NP Contact Type Call Who Is Calling Patient / Member / Family / Caregiver Call Type Triage / Clinical Relationship To Patient Self Return Phone Number [phone] (Primary) Chief Complaint Vomiting Reason for Call Symptomatic / Request for Health Information Initial Comment record 1/2: Caller and son have been vomiting and have nausea. Translation No Nurse Assessment Nurse: Hammonds, RN, Lissa Date/Time (Eastern Time): 01/10/2018 8:20:14 PM Confirm and document reason for call. If symptomatic, describe symptoms. ---Caller and son have been vomiting and have nausea. I started vomiting yesteray and have vomiting constantly at least 20 times. The inside of mouth is moist and I have urinated. No fever. . Does the patient have any new or  worsening symptoms? ---Yes Will a triage be completed? ---Yes Related visit to physician within the last 2 weeks? ---No Does the PT have any chronic conditions? (i.e. diabetes, asthma, etc.) ---No Is the patient pregnant or possibly pregnant? (Ask all females between the ages of 3312-55) ---No Is this a behavioral health or substance abuse call? ---No Guidelines Guideline Title Affirmed Question Affirmed Notes Nurse Date/Time (Eastern Time) Vomiting [1] SEVERE vomiting (e.g., 6 or more times/day, vomits everything) BUT [2] hydrated Hammonds, RN, Lissa 01/10/2018 8:23:10 PM Disp. Time Lamount Cohen(Eastern Time) Disposition Final User 01/10/2018 8:31:10 PM Pharmacy Call ElderonHammonds, RN, Lissa Reason: [facility] [address], [place], [place] [address] [phone] left message on voicemail. 01/10/2018 8:33:34 PM Call Completed Hammonds, RN, Lissa PLEASE NOTE: All timestamps contained within this report are represented as [place] Standard Time. CONFIDENTIALTY NOTICE: This fax transmission is intended only for the addressee. It contains information that is legally privileged, confidential or otherwise protected from use or disclosure. If you are not the intended recipient, you are strictly prohibited from reviewing, disclosing, copying using or disseminating any of this information or taking any action in reliance on or regarding this information. If you have received this fax in error, please notify us immediately by telephone so that we can arrange for its return to us. Phone: [phone], Toll-Free: [phone], Fax: [phone] Page: 2 of 3 Call Id: [id number] 01/10/2018 8:29:39 PM Home Care Yes Hammonds, RN, Alta CorningLissa Caller Disagree/Comply Comply Caller Understands Yes PreDisposition Did not know what to do Care Advice Given Per Guideline HOME CARE: You should be able to treat this at home. SLEEP: * Rest and try to sleep. (Reason: sleep often empties the stomach and relieves the need to vomit.) CLEAR  LIQUIDS: Try to sip small amounts (1 tablespoon or 15 ml)  of liquid frequently (every 5 minutes) for 8 hours, rather than trying to drink a lot of liquid all at one time. SOLID FOOD: CALL BACK IF: * Severe vomiting (6 or more times per day; vomiting everything) lasts more than 8 hours * Signs of dehydration (e.g., no urination over 12 hours, very lightheaded) * You become worse. CARE ADVICE per Vomiting (Adult) guideline. Standing Orders Preparation Additional Instructions Route Frequency Duration Nurse Comments User Name Phenergan 25 mg 1 Suppository # 6 Per Rectum Every 4 Hours Hammonds, RN, Lissa Phenergan 25 mg 1 Suppository # 6 Per Rectum Every 4 Hours Hammonds, RN, Lissa Comments User: Volanda Napoleon, RN Date/Time Lamount Cohen Time): 01/10/2018 8:28:23 PM [facility] [address] [place], [place] [address] [phone] PLEASE NOTE: All timestamps contained within this report are represented as [place] Standard Time. CONFIDENTIALTY NOTICE: This fax transmission is intended only for the addressee. It contains information that is legally privileged, confidential or otherwise protected from use or disclosure. If you are not the intended recipient, you are strictly prohibited from reviewing, disclosing, copying using or disseminating any of this information or taking any action in reliance on or regarding this information. If you have received this fax in error, please notify us immediately by telephone so that we can arrange for its return to [place]. Phone: [phone], Toll-Free: [phone], Fax: [phone] Page: 3 of 3 Call Id: [id number] [facility] [address], [address] [place], [place] [address] [phone] Fax: [phone] MEDICATION ORDER [facility] Night - Client [facility] - Night Date: 01/10/2018 From: QI Department To: Nicki Reaper - NP This is an approved standing order given  by our call center nurse on your behalf. Thank you. Date (Eastern Time): 01/10/2018 7:26:46 PM Triage RN: Volanda Napoleon, RN NAME: Anna [name] PHONE NUMBER: [phone] (Primary), [phone] (Secondary) BIRTHDATE: [birth year] ADDRESS: [place] [address] CALLER: Self NAME: Rx Given Preparation Additional Instructions Route Frequency Duration Nurse Comments User Name Phenergan 25 mg 1 Suppository # 6 Per Rectum Every 4 Hours Hammonds, RN, Lissa Phenergan 25 mg 1 Suppository # 6 Per Rectum Every 4 Hours Hammonds, RN, Lissa No signature is required on standing orders.

## 2018-01-12 NOTE — Telephone Encounter (Signed)
I spoke with pt and the phenergan supp sent in over weekend helped and pt is feeling a lot better today and does not need to be seen. Pt will cb if needed. FYI to Pamala Hurry Baity NP.

## 2018-01-24 IMAGING — US US OB TRANSVAGINAL
1 series · 15 of 28 positions shown · non-contrast
Comparison: 08/21/2016

CLINICAL DATA: Bleeding for 1 week.  Follow-up examination.

EXAM:
TWIN OBSTETRICAL ULTRASOUND <14 WKS

[Series 1: us ob transvaginal · 84 acquisitions, 15 frames shown]
[im 1/84]
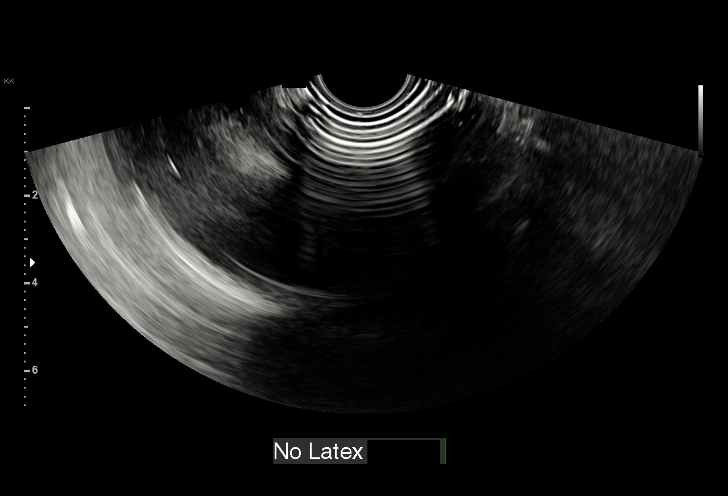
[im 7/84]
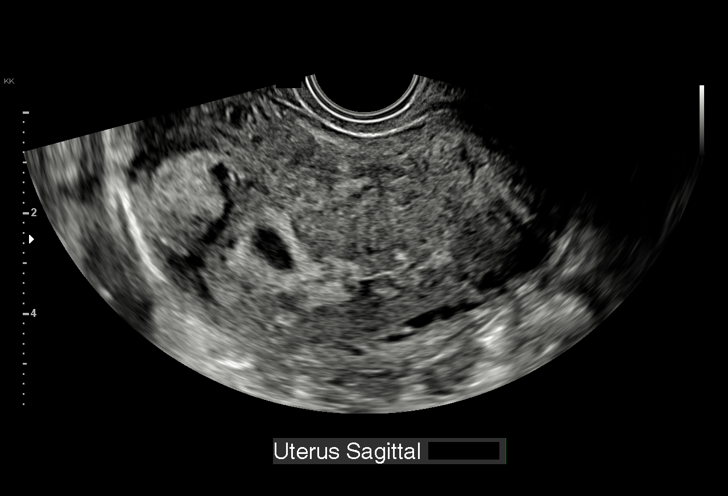
[im 13/84]
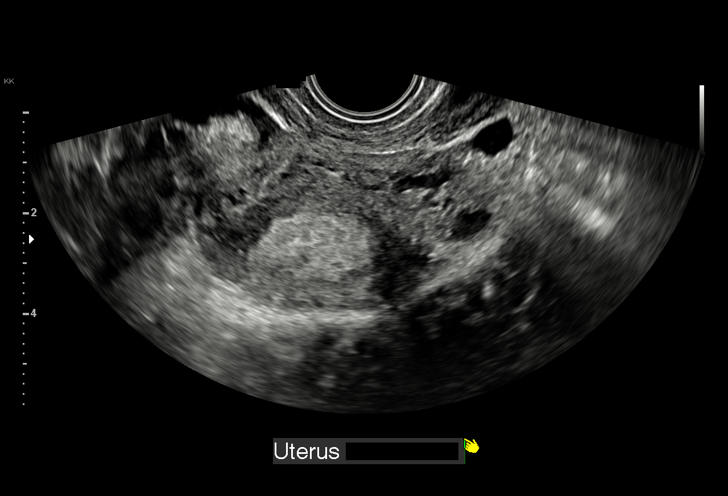
[im 19/84]
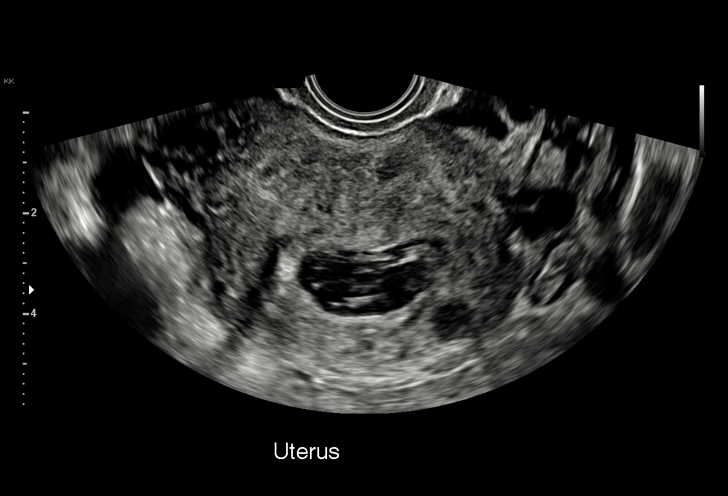
[im 25/84]
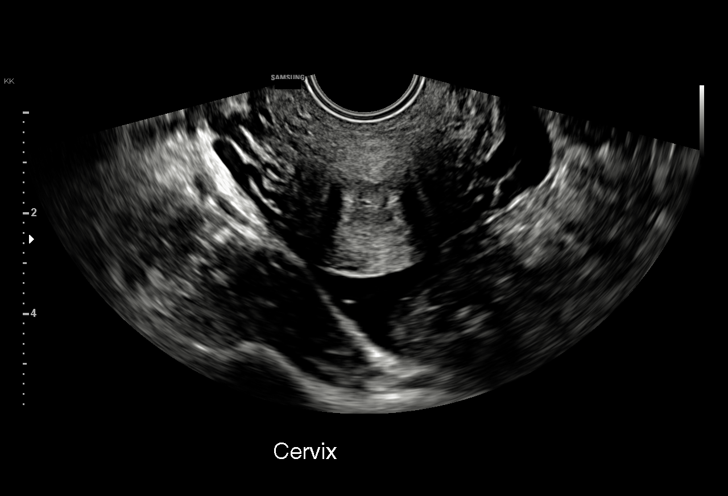
[im 31/84]
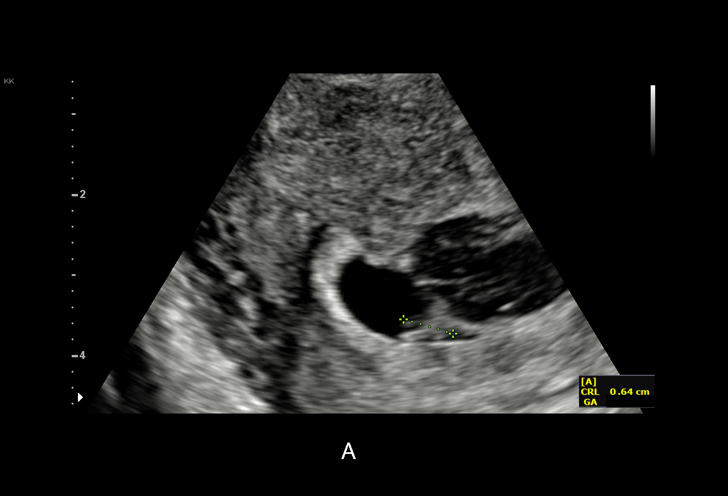
[im 37/84]
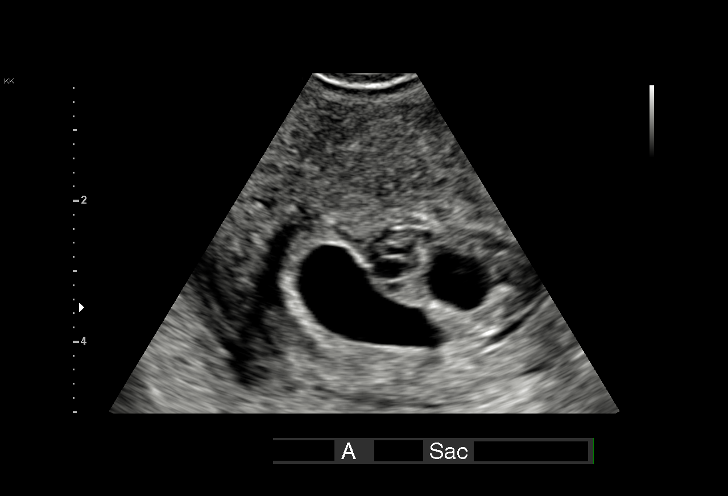
[im 44/84]
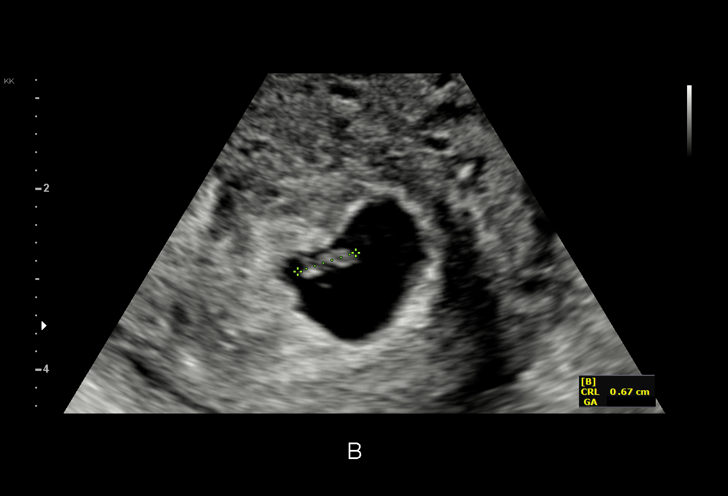
[im 47/84]
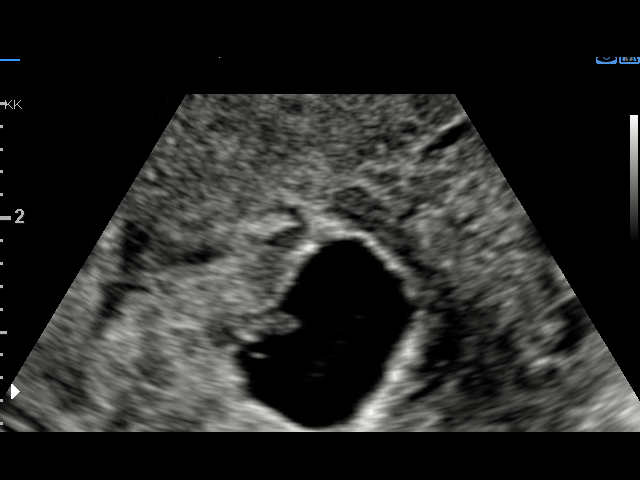
[im 53/84]
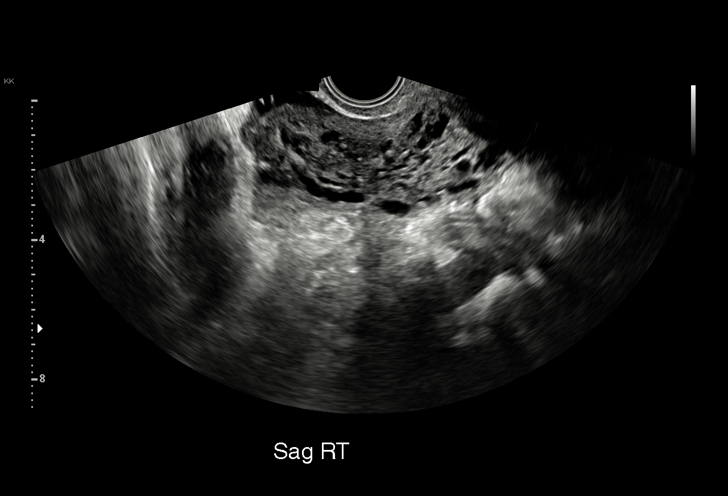
[im 59/84]
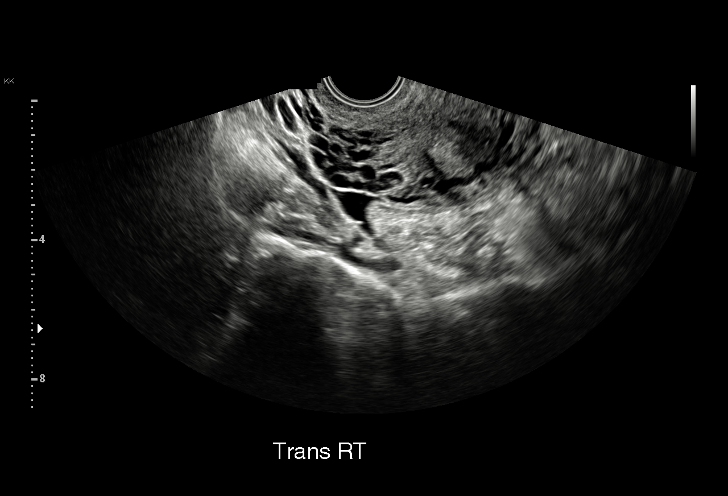
[im 65/84]
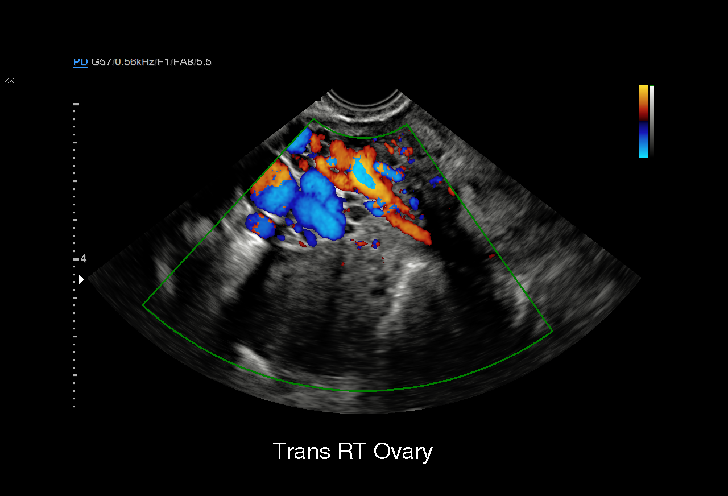
[im 71/84]
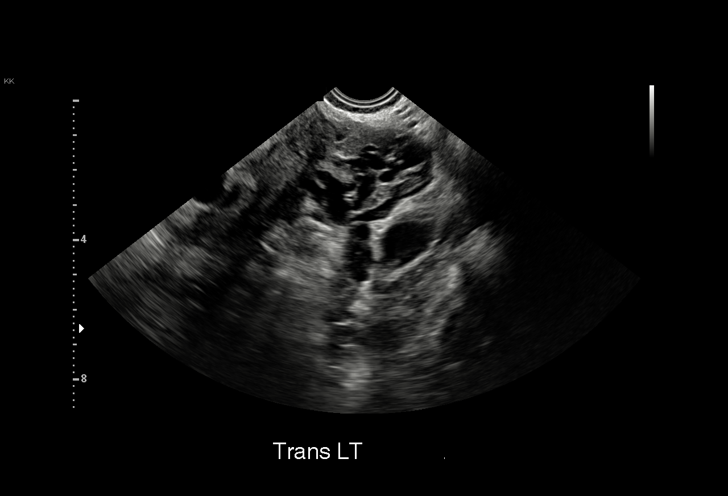
[im 77/84]
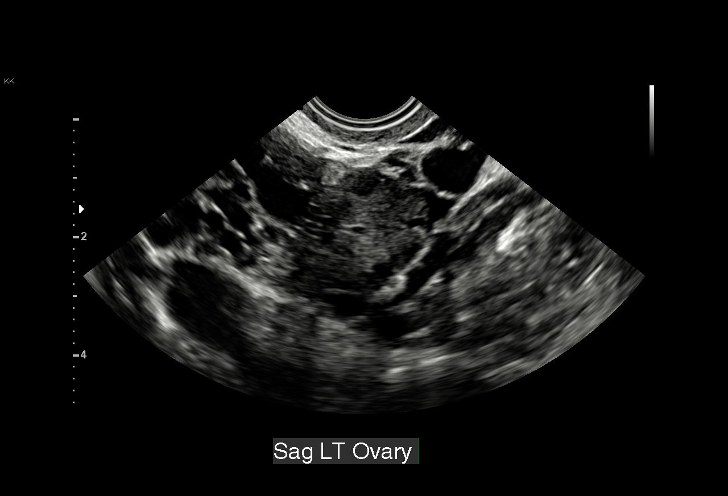
[im 84/84]
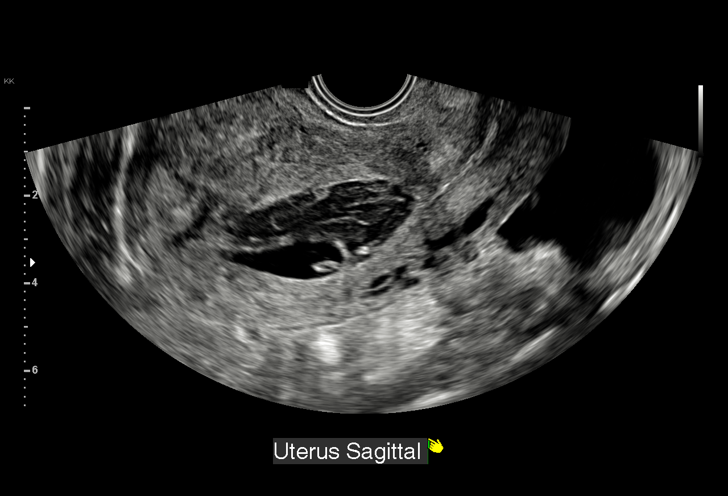

[15 of 28 positions shown; findings below may reference images not displayed]

FINDINGS: Number of IUPs:  2

Chorionicity/Amnionicity:  Dichorionic-diamniotic (thick membrane)

TWIN 1

Yolk sac:  Visualized.

Embryo:  Visualized.

Cardiac Activity: Visualized.

Heart Rate: 53 bpm

CRL:   6.5  mm   6 w 3 d                  US EDC: 04/20/2017

TWIN 2

Yolk sac:  Visualized.

Embryo:  Visualized.

Cardiac Activity: Visualized.

Heart Rate: 71 bpm

CRL:   6.9  mm   6 w 3 d                  US EDC: 04/20/2017

Subchorionic hemorrhage:  Large subchorionic hemorrhage.

Maternal uterus/adnexae: No adnexal mass. Moderate amount of pelvic
free fluid.
IMPRESSION: 1. Live dichorionic -diamniotic twin gestations with relatively low
heart rate. Continued attention on follow-up examination is
recommended.
2. Moderate amount of pelvic free fluid.
3. Large subchorionic hemorrhage.

## 2018-01-29 ENCOUNTER — Encounter: Payer: Self-pay | Admitting: Internal Medicine

## 2018-01-29 ENCOUNTER — Ambulatory Visit (INDEPENDENT_AMBULATORY_CARE_PROVIDER_SITE_OTHER): Payer: 59 | Admitting: Internal Medicine

## 2018-01-29 VITALS — BP 104/70 | HR 71 | Temp 97.9°F | Wt 126.0 lb

## 2018-01-29 DIAGNOSIS — M7521 Bicipital tendinitis, right shoulder: Secondary | ICD-10-CM | POA: Diagnosis not present

## 2018-01-29 MED ORDER — PREDNISONE 10 MG PO TABS: ORAL_TABLET | ORAL | 0 refills | Status: DC

## 2018-01-29 NOTE — Progress Notes (Signed)
Subjective:    Patient ID: Anna [name], female    DOB: 1986/08/03, 32 y.o.   MRN: [id number]  HPI  Pt presents to the clinic today with c/o right shoulder pain. She reports this has been intermittent over the last 5-6 months, but has more consistent in the last few weeks. She describes the pain as sharp and stabbing. The pain does not radiate. It does seem worse with certain movements and with picking up anything with substantial weight. She denies numbness or tingling in her right arm. She reports she had an injury about 5 years ago. Her husband fell on her right shoulder. It hurt her for a few weeks at that time but eventually got better. She never had it evaluated or imaged. She has not taken anything OTC for her symptoms.   Review of Systems      Past Medical History:  Diagnosis Date  . Acne 06/15/2015  . Anxiety   . Anxiety and depression 06/15/2015  . Subchorionic hemorrhage in first trimester     Current Outpatient Medications  Medication Sig Dispense Refill  . predniSONE (DELTASONE) 10 MG tablet Take 3 tabs on days 1-3, take 2 tabs on days 4-6, take 1 tab on days 7-9 18 tablet 0   No current facility-administered medications for this visit.     Allergies  Allergen Reactions  . Lexapro [Escitalopram] Rash    Family History  Problem Relation Age of Onset  . Other Neg Hx     Social History   Socioeconomic History  . Marital status: Married    Spouse name: Not on file  . Number of children: Not on file  . Years of education: Not on file  . Highest education level: Not on file  Occupational History  . Not on file  Social Needs  . Financial resource strain: Not on file  . Food insecurity:    Worry: Not on file    Inability: Not on file  . Transportation needs:    Medical: Not on file    Non-medical: Not on file  Tobacco Use  . Smoking status: Never Smoker  . Smokeless tobacco: Never Used  Substance and Sexual Activity  . Alcohol use: No    Alcohol/week:  0.0 oz    Frequency: Never    Comment: occasional  . Drug use: No  . Sexual activity: Not Currently    Birth control/protection: None, Surgical    Comment: spouse had vasectomy  Lifestyle  . Physical activity:    Days per week: Not on file    Minutes per session: Not on file  . Stress: Not on file  Relationships  . Social connections:    Talks on phone: Not on file    Gets together: Not on file    Attends religious service: Not on file    Active member of club or organization: Not on file    Attends meetings of clubs or organizations: Not on file    Relationship status: Not on file  . Intimate partner violence:    Fear of current or ex partner: Not on file    Emotionally abused: Not on file    Physically abused: Not on file    Forced sexual activity: Not on file  Other Topics Concern  . Not on file  Social History Narrative  . Not on file     Constitutional: Denies fever, malaise, fatigue, headache or abrupt weight changes.  Musculoskeletal: Pt reports right shoulder pain. Denies decrease  in range of motion, difficulty with gait, muscle pain or joint swelling.   No other specific complaints in a complete review of systems (except as listed in HPI above).  Objective:   Physical Exam   BP 104/70   Pulse 71   Temp 97.9 F (36.6 C) (Oral)   Wt 126 lb (57.2 kg)   LMP 01/12/2018   SpO2 98%   BMI 21.63 kg/m  Wt Readings from Last 3 Encounters:  01/29/18 126 lb (57.2 kg)  12/18/17 127 lb (57.6 kg)  10/20/17 126 lb (57.2 kg)    General: Appears her stated age, well developed, well nourished in NAD. Cardiovascular: Radial pulse 2+ bilaterally. Musculoskeletal: Normal internal and external ROM of right shoulder. Negative drop can test. Pain with palpation over the anterior proximal biceps tendon. Strength 5/5 BUE. Neurological: Alert and oriented. Sensation intact to BUE.  BMET    Component Value Date/Time   NA 135 11/17/2012 1845   K 3.3 (L) 11/17/2012 1845    CL 98 11/17/2012 1845   CO2 22 11/17/2012 1845   GLUCOSE 180 (H) 11/17/2012 1845   BUN 18 11/17/2012 1845   CREATININE 0.62 11/17/2012 1845   CALCIUM 10.6 (H) 11/17/2012 1845   GFRNONAA >90 11/17/2012 1845   GFRAA >90 11/17/2012 1845    Lipid Panel  No results found for: CHOL, TRIG, HDL, CHOLHDL, VLDL, LDLCALC  CBC    Component Value Date/Time   WBC 12.8 (H) 04/24/2017 0508   RBC 3.28 (L) 04/24/2017 0508   HGB 9.6 (L) 04/24/2017 0508   HGB 12.2 01/17/2017 0832   HCT 28.4 (L) 04/24/2017 0508   HCT 37.4 01/17/2017 0832   PLT 170 04/24/2017 0508   PLT 215 01/17/2017 0832   MCV 86.6 04/24/2017 0508   MCV 91 01/17/2017 0832   MCH 29.3 04/24/2017 0508   MCHC 33.8 04/24/2017 0508   RDW 14.0 04/24/2017 0508   RDW 13.6 01/17/2017 0832   LYMPHSABS 2.2 04/23/2017 1500   MONOABS 0.7 04/23/2017 1500   EOSABS 0.1 04/23/2017 1500   BASOSABS 0.0 04/23/2017 1500    Hgb A1C No results found for: HGBA1C         Assessment & Plan:   Right Biceps Tendonitis:  eRx for Prednisone x 9 days- avoid NSAID's, take Tylenol for pain if needed Avoid over use Shoulder exercises given If persists or worsen, consider xray vs PT  Return precautions discussed Nicki ReaperBAITY, Kamoria Lucien, NP

## 2018-01-29 NOTE — Patient Instructions (Signed)
Biceps Tendon Tendinitis (Distal) Distal biceps tendon tendinitis is inflammation of the distal biceps tendon. The distal biceps tendon is a strong cord of tissue that connects the biceps muscle, on the front of the upper arm, to a bone (radius) in the elbow. Distal biceps tendon tendinitis can interfere with the ability to bend the elbow and turn the hand palm-up (supination). This condition is usually caused by overusing the elbow joint and the biceps muscle, and it usually heals within 6 weeks. Distal biceps tendon tendinitis may include a grade 1 or grade 2 strain of the tendon. A grade 1 strain is mild, and it involves a slight pull of the tendon without any stretching or noticeable tearing of the tendon. There is usually no loss of biceps muscle strength. A grade 2 strain is moderate, and it involves a small tear in the tendon. The tendon is stretched, and biceps muscle strength is usually decreased. What are the causes? This condition may be caused by:  A sudden increase in the frequency or intensity of activity that involves the elbow and the biceps muscle.  Overuse of the biceps muscle. This can happen when you do the same movements over and over, such as: ? Supination. ? Forceful straightening (hyperextension) of the elbow. ? Bending of the elbow.  A direct, forceful hit or injury (trauma) to the elbow. This is rare.  What increases the risk? The following factors may make you more likely to develop this condition:  Playing contact sports.  Playing sports that involve throwing and overhead movements, including racket sports, gymnastics, weight lifting, or bodybuilding.  Doing physical labor.  Having poor strength and flexibility of the arm and shoulder.  Having injured other parts of the elbow.  What are the signs or symptoms? Symptoms of this condition may include:  Pain and inflammation in the front of the elbow. Pain may get worse during certain movements, such  as: ? Supination. ? Bending the elbow. ? Lifting or carrying objects. ? Throwing.  A feeling of warmth in the front of the elbow.  A crackling sound (crepitation) when you move or touch the elbow or the upper arm.  In some cases, symptoms may return (recur) after treatment, and they may be long-lasting (chronic). How is this diagnosed? This condition is diagnosed based on your symptoms, your medical history, and a physical exam. You may have tests, including X-rays or MRIs. Your health care provider may test your range of motion by having you do arm movements. How is this treated? This condition is treated by resting and icing the injured area, and by doing physical therapy exercises. Depending on the severity of your condition, treatment may also include:  Medicines to help relieve pain and inflammation.  Ultrasound therapy. This is the application of sound waves to the injured area.  Follow these instructions at home: Managing pain, stiffness, and swelling  If directed, put ice on the injured area: ? Put ice in a plastic bag. ? Place a towel between your skin and the bag. ? Leave the ice on for 20 minutes, 2-3 times a day.  Move your fingers often to avoid stiffness and to lessen swelling.  Raise (elevate) the injured area above the level of your heart while you are sitting or lying down.  If directed, apply heat to the affected area before you exercise. Use the heat source that your health care provider recommends, such as a moist heat pack or a heating pad. ? Place a towel between   your skin and the heat source. ? Leave the heat on for 20-30 minutes. ? Remove the heat if your skin turns bright red. This is especially important if you are unable to feel pain, heat, or cold. You may have a greater risk of getting burned. Activity  Return to your normal activities as told by your health care provider. Ask your health care provider what activities are safe for you.  Do not lift  anything that is heavier than 10 lb (4.5 kg) until your health care provider tells you that it is safe.  Avoid activities that cause pain or make your condition worse.  Do exercises as told by your health care provider. General instructions  Take over-the-counter and prescription medicines only as told by your health care provider.  Do not drive or operate heavy machinery while taking prescription pain medicines.  Keep all follow-up visits as told by your health care provider. This is important. How is this prevented?  Warm up and stretch before being active.  Cool down and stretch after being active.  Give your body time to rest between periods of activity.  Make sure to use equipment that fits you.  Be safe and responsible while being active to avoid falls.  Do at least 150 minutes of moderate-intensity exercise each week, such as brisk walking or water aerobics.  Maintain physical fitness, including: ? Strength. ? Flexibility. ? Cardiovascular fitness. ? Endurance. Contact a health care provider if:  You have symptoms that get worse or do not get better after 2 weeks of treatment.  You develop new symptoms. Get help right away if:  You develop severe pain. This information is not intended to replace advice given to you by your health care provider. Make sure you discuss any questions you have with your health care provider. Document Released: 10/28/2005 Document Revised: 07/04/2016 Document Reviewed: 10/06/2015 [employer]vier Interactive Patient Education  2018 [employer]  

## 2018-03-17 DIAGNOSIS — J309 Allergic rhinitis, unspecified: Secondary | ICD-10-CM | POA: Diagnosis not present

## 2018-03-17 DIAGNOSIS — J3089 Other allergic rhinitis: Secondary | ICD-10-CM | POA: Diagnosis not present

## 2018-03-17 DIAGNOSIS — J3081 Allergic rhinitis due to animal (cat) (dog) hair and dander: Secondary | ICD-10-CM | POA: Diagnosis not present

## 2018-03-27 ENCOUNTER — Encounter: Payer: Self-pay | Admitting: Internal Medicine

## 2018-03-27 DIAGNOSIS — M79601 Pain in right arm: Secondary | ICD-10-CM

## 2018-04-21 ENCOUNTER — Ambulatory Visit (INDEPENDENT_AMBULATORY_CARE_PROVIDER_SITE_OTHER): Payer: 59 | Admitting: Orthopaedic Surgery

## 2018-04-21 ENCOUNTER — Ambulatory Visit (INDEPENDENT_AMBULATORY_CARE_PROVIDER_SITE_OTHER): Payer: 59

## 2018-04-21 DIAGNOSIS — M25511 Pain in right shoulder: Secondary | ICD-10-CM

## 2018-04-21 DIAGNOSIS — G8929 Other chronic pain: Secondary | ICD-10-CM

## 2018-04-21 NOTE — Progress Notes (Signed)
Office Visit Note   Patient: Anna [name]           Date of Birth: [birth year]           MRN: [id number] Visit Date: 04/21/2018              Requested by: Lorre Munroe, NP [address] [place], [place] [address] PCP: Lorre Munroe, NP   Assessment & Plan: Visit Diagnoses:  1. Chronic right shoulder pain     Plan: Impression is right shoulder rotator cuff tendinitis.  This point, I do not feel as though the patient is symptomatic enough to proceed with a cortisone injection.  I have provided the patient with a jobe exercise program.  She will follow-up with [place] in 6 to 8 weeks if she has not improved.  Call with concerns or questions in the meantime.  Follow-Up Instructions: Return if symptoms worsen or fail to improve.   Orders:  Orders Placed This Encounter  Procedures  . XR Shoulder Right   No orders of the defined types were placed in this encounter.     Procedures: No procedures performed   Clinical Data: No additional findings.   Subjective: Chief Complaint  Patient presents with  . Right Shoulder - Pain    HPI patient is a pleasant 32 year old female who presents to our clinic today with right shoulder pain.  This began approximately 5 to 6 months ago when she started working out at [employer]  The pain she has is to the anterior aspect of the shoulder.  The pain is intermittent and only occurs when lifting things when she is doing activities such as pull-ups or push-ups.  She has no pain at rest.  This has improved as she is stopped working out.  No numbness, tingling or burning.  The only previous injury she can think of was in 2015 when tubing in the water and her husband landing on top of her right shoulder.  She initially had some pain but was never seen by Dr.  This did resolve on its own.  Review of Systems as detailed in HPI.  All others reviewed and are negative.   Objective: Vital Signs: There were no vitals taken for this visit.  Physical  Exam well-developed and well-nourished female no acute distress.  Alert and oriented x3.  Ortho Exam examination of her right shoulder reveals no tenderness over the [facility] joint.  Full painless active range of motion in all planes.  Negative empty can.  Minimally positive cross body abduction.  Specialty Comments:  No specialty comments available.  Imaging: Xr Shoulder Right  Result Date: 04/21/2018 No acute or structural abnormalities    PMFS History: Patient Active Problem List   Diagnosis Date Noted  . Chronic right shoulder pain 04/21/2018   Past Medical History:  Diagnosis Date  . Acne 06/15/2015  . Anxiety   . Anxiety and depression 06/15/2015  . Subchorionic hemorrhage in first trimester     Family History  Problem Relation Age of Onset  . Other Neg Hx     No past surgical history on file. Social History   Occupational History  . Not on file  Tobacco Use  . Smoking status: Never Smoker  . Smokeless tobacco: Never Used  Substance and Sexual Activity  . Alcohol use: No    Alcohol/week: 0.0 oz    Frequency: Never    Comment: occasional  . Drug use: No  . Sexual activity: Not  Currently    Birth control/protection: None, Surgical    Comment: spouse had vasectomy

## 2018-05-28 ENCOUNTER — Ambulatory Visit (INDEPENDENT_AMBULATORY_CARE_PROVIDER_SITE_OTHER): Payer: 59 | Admitting: Internal Medicine

## 2018-05-28 ENCOUNTER — Encounter: Payer: Self-pay | Admitting: Internal Medicine

## 2018-05-28 VITALS — BP 108/72 | HR 67 | Temp 98.7°F | Wt 127.0 lb

## 2018-05-28 DIAGNOSIS — M25562 Pain in left knee: Secondary | ICD-10-CM

## 2018-05-28 DIAGNOSIS — S76112A Strain of left quadriceps muscle, fascia and tendon, initial encounter: Secondary | ICD-10-CM | POA: Diagnosis not present

## 2018-05-28 NOTE — Patient Instructions (Signed)
Quadriceps Strain A quadriceps strain is an injury to the muscles or tendons on the front of the thigh. The quadriceps muscles are used in straightening the knee and bending the hip. A strain occurs when the muscle is overstretched or overloaded. There are three types of strains:  Grade 1 is a mild strain. It involves a stretching or minor tearing of your muscle fibers or tendons. You should have little, if any, trouble using your thigh.  Grade 2 is a moderate strain. It involves a partial tearing of your muscle fibers or tendons. You will have pain and some loss of strength in your thigh.  Grade 3 is a severe strain. It involves a complete tearing of your muscle fibers or tendon. It causes severe pain and loss of strength in your thigh.  Recovery will take a few weeks or longer, depending on how bad your strain is. What are the causes? This injury is caused by overextending the muscles in the thigh. What increases the risk? The following factors may make you more likely to develop this injury:  Participating in: ? Activities that involve jumping or sprinting. ? Contact sports, such as football or soccer.  Having a previous injury to your thigh or knee.  Having poor strength and flexibility.  Not warming up properly before activity.  Having one leg that is much stronger than the other.  Exercising to the point of exhaustion.  What are the signs or symptoms? Symptoms of this condition include:  Sudden, severe pain in your thigh.  Pain and tenderness over your quadriceps muscles. The pain gets worse when you use these muscles.  Muscle spasm in your thigh.  Bruising.  Having trouble with tasks that involve using your quadriceps muscle, such as walking.  A crackling sound when the tendon is moved or touched.  How is this diagnosed? This condition may be diagnosed based on your symptoms, your medical history, and a physical exam. Your health care provider may order tests such  as an X-ray, ultrasound, or MRI to further evaluate your injury and to rule out other conditions. How is this treated? Treatment for this condition may include:  Resting your leg and avoiding activities that cause pain.  Taking medicine to help reduce pain and inflammation.  Applying ice to the area to relieve swelling and inflammation.  Using crutches until you can walk without pain.  Working with a physical therapist on exercises to restore strength and flexibility in your thigh.  In rare cases, surgery may be needed. Follow these instructions at home: Managing pain, stiffness, and swelling  If directed, put ice on the injured area: ? Put ice in a plastic bag. ? Place a towel between your skin and the bag. ? Leave the ice on for 20 minutes, 2-3 times a day.  Raise (elevate) the injured area above the level of your heart while you are sitting or lying down. Activity  Do not use the injured limb to support your body weight until your health care provider says that you can. Use crutches as told by your health care provider.  Do exercises as told by your health care provider.  Return to your normal activities as told by your health care provider. Ask your health care provider what activities are safe for you. General instructions  Take over-the-counter and prescription medicines only as told by your health care provider.  Keep all follow-up visits as told by your health care provider. This is important. How is this prevented?    Warm up and stretch before being active.  Cool down and stretch after being active.  Give your body time to rest between periods of activity.  Make sure to use equipment that fits you.  Be safe and responsible while being active to avoid falls.  Do at least 150 minutes of moderate-intensity exercise each week, such as brisk walking or water aerobics.  Maintain physical fitness, including: ? Strength ? Flexibility. ? Cardiovascular  fitness. ? Endurance. Contact a health care provider if:  Your pain, bruising, or tenderness gets worse, even with treatment.  Your leg becomes weaker. This information is not intended to replace advice given to you by your health care provider. Make sure you discuss any questions you have with your health care provider. Document Released: 10/28/2005 Document Revised: 07/02/2016 Document Reviewed: 08/01/2015 Elsevier Interactive Patient Education  2018 [employer]  

## 2018-05-28 NOTE — Progress Notes (Signed)
Subjective:    Patient ID: Anna [name], female    DOB: [birth year], 32 y.o.   MRN: [id number]  HPI  Pt presents to the clinic today with c/o left knee pain. She reports she woke up yesterday with a burning pain behind her left knee. The pain can radiate up and down the leg with certain movements. She denies any injury to the area but has been doing squats and jumping around in the pool the day before. She denies swelling, bruising, redness or warmth. She has taken Ibuprofen with some relief.  Review of Systems      Past Medical History:  Diagnosis Date  . Acne 06/15/2015  . Anxiety   . Anxiety and depression 06/15/2015  . Subchorionic hemorrhage in first trimester     Current Outpatient Medications  Medication Sig Dispense Refill  . predniSONE (DELTASONE) 10 MG tablet Take 3 tabs on days 1-3, take 2 tabs on days 4-6, take 1 tab on days 7-9 18 tablet 0   No current facility-administered medications for this visit.     Allergies  Allergen Reactions  . Lexapro [Escitalopram] Rash    Family History  Problem Relation Age of Onset  . Other Neg Hx     Social History   Socioeconomic History  . Marital status: Married    Spouse name: Not on file  . Number of children: Not on file  . Years of education: Not on file  . Highest education level: Not on file  Occupational History  . Not on file  Social Needs  . Financial resource strain: Not on file  . Food insecurity:    Worry: Not on file    Inability: Not on file  . Transportation needs:    Medical: Not on file    Non-medical: Not on file  Tobacco Use  . Smoking status: Never Smoker  . Smokeless tobacco: Never Used  Substance and Sexual Activity  . Alcohol use: No    Alcohol/week: 0.0 oz    Frequency: Never    Comment: occasional  . Drug use: No  . Sexual activity: Not Currently    Birth control/protection: None, Surgical    Comment: spouse had vasectomy  Lifestyle  . Physical activity:    Days per week: Not  on file    Minutes per session: Not on file  . Stress: Not on file  Relationships  . Social connections:    Talks on phone: Not on file    Gets together: Not on file    Attends religious service: Not on file    Active member of club or organization: Not on file    Attends meetings of clubs or organizations: Not on file    Relationship status: Not on file  . Intimate partner violence:    Fear of current or ex partner: Not on file    Emotionally abused: Not on file    Physically abused: Not on file    Forced sexual activity: Not on file  Other Topics Concern  . Not on file  Social History Narrative  . Not on file     Constitutional: Denies fever, malaise, fatigue, headache or abrupt weight changes.  Musculoskeletal: Pt reports left knee pain. Denies decrease in range of motion, difficulty with gait, muscle pain or joint swelling.  Skin: Denies redness, rashes, lesions or ulcercations.  Neurological: Denies dizziness, difficulty with memory, difficulty with speech or problems with balance and coordination.    No other specific complaints in  a complete review of systems (except as listed in HPI above).  Objective:   Physical Exam  BP 108/72   Pulse 67   Temp 98.7 F (37.1 C) (Oral)   Wt 127 lb (57.6 kg)   SpO2 98%   BMI 21.80 kg/m  Wt Readings from Last 3 Encounters:  05/28/18 127 lb (57.6 kg)  01/29/18 126 lb (57.2 kg)  12/18/17 127 lb (57.6 kg)    General: Appears her stated age, well developed, well nourished in NAD. Musculoskeletal: Normal flexion and extension of the left knee. Pain with palpation of the quadriceps tendon. No signs of joint swelling. No difficulty with gait.  Neurological: Alert and oriented.    BMET    Component Value Date/Time   NA 135 11/17/2012 1845   K 3.3 (L) 11/17/2012 1845   CL 98 11/17/2012 1845   CO2 22 11/17/2012 1845   GLUCOSE 180 (H) 11/17/2012 1845   BUN 18 11/17/2012 1845   CREATININE 0.62 11/17/2012 1845   CALCIUM 10.6  (H) 11/17/2012 1845   GFRNONAA >90 11/17/2012 1845   GFRAA >90 11/17/2012 1845    Lipid Panel  No results found for: CHOL, TRIG, HDL, CHOLHDL, VLDL, LDLCALC  CBC    Component Value Date/Time   WBC 12.8 (H) 04/24/2017 0508   RBC 3.28 (L) 04/24/2017 0508   HGB 9.6 (L) 04/24/2017 0508   HGB 12.2 01/17/2017 0832   HCT 28.4 (L) 04/24/2017 0508   HCT 37.4 01/17/2017 0832   PLT 170 04/24/2017 0508   PLT 215 01/17/2017 0832   MCV 86.6 04/24/2017 0508   MCV 91 01/17/2017 0832   MCH 29.3 04/24/2017 0508   MCHC 33.8 04/24/2017 0508   RDW 14.0 04/24/2017 0508   RDW 13.6 01/17/2017 0832   LYMPHSABS 2.2 04/23/2017 1500   MONOABS 0.7 04/23/2017 1500   EOSABS 0.1 04/23/2017 1500   BASOSABS 0.0 04/23/2017 1500    Hgb A1C No results found for: HGBA1C          Assessment & Plan:   Left Knee Pain, Quadriceps Strain:  Just a strain Avoid overuse Ibuprofen as needed Ice may be helpful Will xray if worse  Return precautions discussed Nicki Reaper, NP

## 2018-07-19 ENCOUNTER — Telehealth: Payer: 59 | Admitting: Family

## 2018-07-19 DIAGNOSIS — R059 Cough, unspecified: Secondary | ICD-10-CM

## 2018-07-19 DIAGNOSIS — R05 Cough: Secondary | ICD-10-CM

## 2018-07-19 MED ORDER — BENZONATATE 100 MG PO CAPS: 100.0000 mg | ORAL_CAPSULE | Freq: Three times a day (TID) | ORAL | 0 refills | Status: DC | PRN

## 2018-07-19 MED ORDER — AZITHROMYCIN 250 MG PO TABS: ORAL_TABLET | ORAL | 0 refills | Status: DC

## 2018-07-19 NOTE — Progress Notes (Signed)

## 2018-07-20 ENCOUNTER — Ambulatory Visit: Payer: 59 | Admitting: Internal Medicine

## 2018-07-20 ENCOUNTER — Telehealth: Payer: Self-pay

## 2018-07-20 NOTE — Telephone Encounter (Signed)
PLEASE NOTE: All timestamps contained within this report are represented as [place] Standard Time. CONFIDENTIALTY NOTICE: This fax transmission is intended only for the addressee. It contains information that is legally privileged, confidential or otherwise protected from use or disclosure. If you are not the intended recipient, you are strictly prohibited from reviewing, disclosing, copying using or disseminating any of this information or taking any action in reliance on or regarding this information. If you have received this fax in error, please notify us immediately by telephone so that we can arrange for its return to [place]. Phone: [phone], Toll-Free: [phone], Fax: [phone] Page: 1 of 1 Call Id: [id number] [facility] Night - Client Nonclinical Telephone Record [facility] Client [facility] Night - Client Client Site [facility] - Night Physician Nicki Reaper - NP Contact Type Call Who Is Calling Patient / Member / Family / Caregiver Caller Name Kendraya Holaday Caller Phone Number [phone] Patient Name Anna [name] Patient DOB September 08, 1986 Call Type Message Only Information Provided Reason for Call Request to Schedule Office Appointment Initial Comment Caller states, wanting to cancel an appt Mon 1015am Additional Comment Call Closed By: Terrilee Files Transaction Date/Time: 07/19/2018 11:43:49 AM (ET)

## 2018-07-20 NOTE — Telephone Encounter (Signed)
Pt has appt 07/20/18 at 10:15 with Pamala Hurry NP.

## 2018-07-20 NOTE — Telephone Encounter (Signed)
Ok to cancel appt

## 2018-07-24 MED FILL — MONTELUKAST SOD 10 MG TAB: 10 | 30 days supply | Qty: 30 | Fill #0

## 2018-08-18 ENCOUNTER — Telehealth: Payer: 59 | Admitting: Physician Assistant

## 2018-08-18 DIAGNOSIS — J22 Unspecified acute lower respiratory infection: Secondary | ICD-10-CM | POA: Diagnosis not present

## 2018-08-18 MED ORDER — AZITHROMYCIN 250 MG PO TABS: ORAL_TABLET | ORAL | 0 refills | Status: AC

## 2018-08-18 NOTE — Progress Notes (Signed)
We are sorry that you are not feeling well.  Here is how we plan to help!  Based on your presentation I believe you most likely have A cough due to bacteria.  When patients have a fever and a productive cough with a change in color or increased sputum production, we are concerned about bacterial bronchitis.  If left untreated it can progress to pneumonia.  If your symptoms do not improve with your treatment plan it is important that you contact your provider.   I have prescribed Azithromyin 250 mg: two tablets now and then one tablet daily for 4 additonal days      From your responses in the eVisit questionnaire you describe inflammation in the upper respiratory tract which is causing a significant cough.  This is commonly called Bronchitis and has four common causes:   Allergies Viral Infections Acid Reflux Bacterial Infection Allergies, viruses and acid reflux are treated by controlling symptoms or eliminating the cause. An example might be a cough caused by taking certain blood pressure medications. You stop the cough by changing the medication. Another example might be a cough caused by acid reflux. Controlling the reflux helps control the cough.  USE OF BRONCHODILATOR ("RESCUE") INHALERS: There is a risk from using your bronchodilator too frequently.  The risk is that over-reliance on a medication which only relaxes the muscles surrounding the breathing tubes can reduce the effectiveness of medications prescribed to reduce swelling and congestion of the tubes themselves.  Although you feel brief relief from the bronchodilator inhaler, your asthma may actually be worsening with the tubes becoming more swollen and filled with mucus.  This can delay other crucial treatments, such as oral steroid medications. If you need to use a bronchodilator inhaler daily, several times per day, you should discuss this with your provider.  There are probably better treatments that could be used to keep your asthma  under control.     HOME CARE Only take medications as instructed by your medical team. Complete the entire course of an antibiotic. Drink plenty of fluids and get plenty of rest. Avoid close contacts especially the very young and the elderly Cover your mouth if you cough or cough into your sleeve. Always remember to wash your hands A steam or ultrasonic humidifier can help congestion.   GET HELP RIGHT AWAY IF: You develop worsening fever. You become short of breath You cough up blood. Your symptoms persist after you have completed your treatment plan MAKE SURE YOU  Understand these instructions. Will watch your condition. Will get help right away if you are not doing well or get worse.  Your e-visit answers were reviewed by a board certified advanced clinical practitioner to complete your personal care plan.  Depending on the condition, your plan could have included both over the counter or prescription medications. If there is a problem please reply once you have received a response from your provider. Your safety is important to us.  If you have drug allergies check your prescription carefully.    You can use MyChart to ask questions about today's visit, request a non-urgent call back, or ask for a work or school excuse for 24 hours related to this e-Visit. If it has been greater than 24 hours you will need to follow up with your provider, or enter a new e-Visit to address those concerns. You will get an e-mail in the next two days asking about your experience.  I hope that your e-visit has been valuable   and will speed your recovery. Thank you for using e-visits. 

## 2018-08-26 ENCOUNTER — Ambulatory Visit: Payer: 59 | Admitting: Internal Medicine

## 2018-08-28 ENCOUNTER — Ambulatory Visit (INDEPENDENT_AMBULATORY_CARE_PROVIDER_SITE_OTHER): Payer: 59 | Admitting: Internal Medicine

## 2018-08-28 ENCOUNTER — Ambulatory Visit (INDEPENDENT_AMBULATORY_CARE_PROVIDER_SITE_OTHER)
Admission: RE | Admit: 2018-08-28 | Discharge: 2018-08-28 | Disposition: A | Discharge status: Home / Self Care | Payer: 59 | Source: Ambulatory Visit | Attending: Internal Medicine | Admitting: Internal Medicine

## 2018-08-28 ENCOUNTER — Encounter: Payer: Self-pay | Admitting: Internal Medicine

## 2018-08-28 VITALS — BP 114/70 | HR 76 | Temp 98.3°F | Resp 12 | Ht 64.5 in | Wt 121.0 lb

## 2018-08-28 DIAGNOSIS — R053 Chronic cough: Secondary | ICD-10-CM | POA: Insufficient documentation

## 2018-08-28 DIAGNOSIS — R05 Cough: Secondary | ICD-10-CM | POA: Diagnosis not present

## 2018-08-28 MED ORDER — AMOXICILLIN-POT CLAVULANATE 875-125 MG PO TABS: 1.0000 | ORAL_TABLET | Freq: Two times a day (BID) | ORAL | 0 refills | Status: DC

## 2018-08-28 NOTE — Assessment & Plan Note (Addendum)
Likely just sinus related Allergies well controlled so probably infection Will check CXR due to persistence CXR looks fine--will await overread Try augmentin

## 2018-08-28 NOTE — Progress Notes (Signed)
Subjective:    Patient ID: Anna [name], female    DOB: 1986/09/14, 32 y.o.   MRN: [id number]  HPI Here due to persistent cough  Started about 2 months ago Cleared with first antibitoic--but then recurred Treated again (z-pak both times) Did have sinus pressure, post nasal drip, etc Clear nasal drainage but green mucus with coughing it up Throat feels dry and burning No SOB No wheezing  Has had 2 e-visits Multiple OTC meds singulair and flonase chronically  Current Outpatient Medications on File Prior to Visit  Medication Sig Dispense Refill  . loratadine (CLARITIN) 10 MG tablet Take 10 mg by mouth daily.    . montelukast (SINGULAIR) 10 MG tablet      No current facility-administered medications on file prior to visit.     Allergies  Allergen Reactions  . Lexapro [Escitalopram] Rash    Past Medical History:  Diagnosis Date  . Acne 06/15/2015  . Anxiety   . Anxiety and depression 06/15/2015  . Subchorionic hemorrhage in first trimester     History reviewed. No pertinent surgical history.  Family History  Problem Relation Age of Onset  . Other Neg Hx     Social History   Socioeconomic History  . Marital status: Married    Spouse name: Not on file  . Number of children: Not on file  . Years of education: Not on file  . Highest education level: Not on file  Occupational History  . Not on file  Social Needs  . Financial resource strain: Not on file  . Food insecurity:    Worry: Not on file    Inability: Not on file  . Transportation needs:    Medical: Not on file    Non-medical: Not on file  Tobacco Use  . Smoking status: Never Smoker  . Smokeless tobacco: Never Used  Substance and Sexual Activity  . Alcohol use: No    Alcohol/week: 0.0 standard drinks    Frequency: Never    Comment: occasional  . Drug use: No  . Sexual activity: Not Currently    Birth control/protection: None, Surgical    Comment: spouse had vasectomy  Lifestyle  . Physical  activity:    Days per week: Not on file    Minutes per session: Not on file  . Stress: Not on file  Relationships  . Social connections:    Talks on phone: Not on file    Gets together: Not on file    Attends religious service: Not on file    Active member of club or organization: Not on file    Attends meetings of clubs or organizations: Not on file    Relationship status: Not on file  . Intimate partner violence:    Fear of current or ex partner: Not on file    Emotionally abused: Not on file    Physically abused: Not on file    Forced sexual activity: Not on file  Other Topics Concern  . Not on file  Social History Narrative  . 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 No distress.  HENT:  Mouth/Throat: Oropharynx is clear and moist. No oropharyngeal exudate.  No sinus tenderness  TMs normal Mild nasal inflammation  Neck: No thyromegaly present.  Respiratory: Effort normal and breath sounds normal. No respiratory distress. She has no wheezes. She has no rales.  Lymphadenopathy:    She has no cervical adenopathy.           Assessment & Plan:

## 2018-09-01 ENCOUNTER — Encounter (INDEPENDENT_AMBULATORY_CARE_PROVIDER_SITE_OTHER): Payer: 59 | Admitting: Ophthalmology

## 2018-09-04 MED FILL — GATIFLOXACIN 0.5% EYE DROPS: 0.5 | 8 days supply | Qty: 3 | Fill #0

## 2018-09-04 MED FILL — ZOLPIDEM TARTRATE 10 MG TAB: 10 | 5 days supply | Qty: 5 | Fill #0

## 2018-11-17 ENCOUNTER — Telehealth: Payer: No Typology Code available for payment source | Admitting: Physician Assistant

## 2018-11-17 DIAGNOSIS — J019 Acute sinusitis, unspecified: Secondary | ICD-10-CM

## 2018-11-17 DIAGNOSIS — B9689 Other specified bacterial agents as the cause of diseases classified elsewhere: Secondary | ICD-10-CM

## 2018-11-17 MED ORDER — AMOXICILLIN-POT CLAVULANATE 875-125 MG PO TABS: 1.0000 | ORAL_TABLET | Freq: Two times a day (BID) | ORAL | 0 refills | Status: DC

## 2018-11-17 MED ORDER — AMOXICILLIN-POT CLAVULANATE 875-125 MG PO TABS: 1.0000 | ORAL_TABLET | Freq: Two times a day (BID) | ORAL | 0 refills | Status: AC

## 2018-11-17 NOTE — Progress Notes (Signed)
We are sorry that you are not feeling well.  Here is how we plan to help!  Based on what you have shared with me it looks like you have sinusitis.  Sinusitis is inflammation and infection in the sinus cavities of the head.  Based on your presentation I believe you most likely have Acute Bacterial Sinusitis.  This is an infection caused by bacteria and is treated with antibiotics. I have prescribed Augmentin 875mg /125mg  one tablet twice daily with food, for 7 days. You may use an oral decongestant such as Mucinex D or if you have glaucoma or high blood pressure use plain Mucinex. Saline nasal spray help and can safely be used as often as needed for congestion.  If you develop worsening sinus pain, fever or notice severe headache and vision changes, or if symptoms are not better after completion of antibiotic, please schedule an appointment with a health care provider.    Sinus infections are not as easily transmitted as other respiratory infection, however we still recommend that you avoid close contact with loved ones, especially the very young and elderly.  Remember to wash your hands thoroughly throughout the day as this is the number one way to prevent the spread of infection!  Home Care: Only take medications as instructed by your medical team. Complete the entire course of an antibiotic. Do not take these medications with alcohol. A steam or ultrasonic humidifier can help congestion.  You can place a towel over your head and breathe in the steam from hot water coming from a faucet. Avoid close contacts especially the very young and the elderly. Cover your mouth when you cough or sneeze. Always remember to wash your hands.  Get Help Right Away If: You develop worsening fever or sinus pain. You develop a severe head ache or visual changes. Your symptoms persist after you have completed your treatment plan.  Make sure you Understand these instructions. Will watch your condition. Will get  help right away if you are not doing well or get worse.  Your e-visit answers were reviewed by a board certified advanced clinical practitioner to complete your personal care plan.  Depending on the condition, your plan could have included both over the counter or prescription medications.  If there is a problem please reply once you have received a response from your provider.  Your safety is important to us.  If you have drug allergies check your prescription carefully.    You can use MyChart to ask questions about today's visit, request a non-urgent call back, or ask for a work or school excuse for 24 hours related to this e-Visit. If it has been greater than 24 hours you will need to follow up with your provider, or enter a new e-Visit to address those concerns.  You will get an e-mail in the next two days asking about your experience.  I hope that your e-visit has been valuable and will speed your recovery. Thank you for using e-visits.    ===View-only below this line===   ----- Message -----    From: Anna [name]    Sent: 11/17/2018  2:35 PM EST      To: E-Visit Mailing List Subject: E-Visit Submission: Cough  E-Visit Submission: Cough --------------------------------  Question: How long have you been coughing? Answer:   7 or more days  Question: How would you describe the cough? Answer:   A cough that is part of a cold  Question: How often are you coughing? Answer:  Constantly  Question: Does the cough prevent you from sleeping at night? Answer:   Yes  Question: What other symptoms have you experienced with the cough? Answer:   Runny nose            Sore throat  Question: Do you have a fever? Answer:   No, I do not have a fever  Question: Describe your sore throat: Answer:   I'm certain my throat is irritated due to the constant drainage. It's only lightly bothered.  Question: How long have you had a sore throat? Answer:   7 or more days  Question: Do you  have any tenderness or swelling in your neck? Answer:   No  Question: Are you coughing up any mucus? Answer:   I am coughing up a lot of mucus  Question: Do you use a maintenance inhaler? Answer:   No  Question: Do you use a rescue inhaler (such as Ventolin?) Answer:   No  Question: Have you previously required a prescription for prednisone for cough? Answer:   No  Question: Are you diabetic? Answer:   No  Question: Are you pregnant? Answer:   I am confident that I am not pregnant  Question: Are you breastfeeding? Answer:   No  Question: What is the appearance of the mucus? Answer:   The mucus is thick            The mucus has changed from thin to thick            The mucus has changed from clear to colored  Question: Do you have any of the following? Answer:   None of the above  Question: Do you smoke? Answer:   No  Question: Have you ever smoked? Answer:   I have never smoked  Question: Are there people you know with similar symptoms? Answer:   No  Question: Are you experiencing any of the following? Answer:   None of  the above  Question: Are you having difficulty breathing? Answer:   No  Question: Is your coughing worse when you are exposed to pollen, dust, or other things in the environment? Answer:   No  Question: Have you been treated for a similar cough in the past? Answer:   Yes  Question: What treatments have worked in the past?  Answer:   Antibiotics due to upper respiratory infection casusing the cough.  Question: What treatment(s) in the past have been unsuccessful? Answer:     Question: Have you ever been diagnosed with asthma, bronchitis, or lung disease? Answer:   No  Question: Have you recently started on any medications for your heart or for blood pressure? Answer:   No  Question: Have you recently been hospitalized? Answer:   No  Question: Please list your medication allergies that you may have ? (If 'none' , please list as  'none') Answer:   None  Question: Please list any additional comments  Answer:   I started having symptoms of a cold virus on Monday December 23rd. By the end of the week most of the symptoms had resolved by over-the-counter treatments. I have taken Flonase and histamines saline drops and ibuprofen. However the drainage did not clear up it is the only symptom that seems to have gotten worse. It was thin clear mucus and as of a week ago it began to be thicker I was still treating myself with over the counter medications. I am able to get most of it out. The  past week they clear mucus has have a light greenish yellow tint and as of yesterday I noticed that it is a thick white color . It does not have any brown or red tent at all.

## 2018-11-26 ENCOUNTER — Telehealth: Payer: Self-pay

## 2018-11-26 ENCOUNTER — Encounter: Payer: Self-pay | Admitting: Internal Medicine

## 2018-11-26 ENCOUNTER — Ambulatory Visit (INDEPENDENT_AMBULATORY_CARE_PROVIDER_SITE_OTHER): Payer: No Typology Code available for payment source | Admitting: Internal Medicine

## 2018-11-26 VITALS — BP 106/68 | HR 72 | Temp 98.0°F | Wt 124.0 lb

## 2018-11-26 DIAGNOSIS — T50905A Adverse effect of unspecified drugs, medicaments and biological substances, initial encounter: Secondary | ICD-10-CM | POA: Diagnosis not present

## 2018-11-26 MED ORDER — PREDNISONE 10 MG PO TABS: ORAL_TABLET | ORAL | 0 refills | Status: DC

## 2018-11-26 NOTE — Telephone Encounter (Signed)
Pt called on 08/24/19 developed red patchy rash that burns and itches. Pt has been taking benadryl round the clock since 08/24/19 and rash not resolved.pt did touch her dog and pt has possible allergy to dog and also prior to rash pt had eaten seafood x 2. No difficulty breathing and mouth, tongue and throat are not swollen but when pt swallows she said her throat has slight tightness; pt is in no distress. Pt scheduled appt Hedda Slade NP 11/26/18 at 11:30 and if pt condition worsens or difficulty breathing or throat, mouth, or tongue swelling to go to ED.FYI to Pamala Hurry NP.

## 2018-11-26 NOTE — Patient Instructions (Signed)
Drug Rash  A drug rash occurs when a medicine causes a change in the color or texture of the skin. It can develop minutes, hours, or days after you take the medicine. The rash may appear on a small area of skin or all over your body. What are the causes? This condition is usually caused by your body's reaction (allergy) to a medicine. It can also be caused by exposure to sunlight after taking a medicine that makes your skin sensitive to light. Though any medicine can cause a rash or reaction, medicines that are more likely to cause rashes include:  Penicillin.  Antibiotic medicines.  Medicines that treat seizures.  Medicines that treat cancer (chemotherapy).  Aspirin and other NSAIDs.  Injectable dyes that contain iodine.  Insulin. What are the signs or symptoms? Symptoms of this condition include:  Redness.  Tiny bumps.  Peeling.  Itching.  Itchy welts (hives).  Swelling. How is this diagnosed? This condition may be diagnosed based on:  A physical exam.  Tests to find out which medicine caused the rash. These tests may include: ? Skin tests. ? Blood tests. ? Challenge test. For this test, you stop taking all the medicines that you do not need to take. Then, you start taking them again by adding back one medicine at a time. How is this treated? This condition is treated with medicines, including:  Antihistamine. This may be given to relieve itching.  NSAIDs. These may be given to reduce swelling and to treat pain.  A steroid medicine. This may be given to reduce swelling. The rash usually goes away when you stop taking the medicine that caused it. Follow these instructions at home:  Take over-the-counter and prescription medicines only as told by your health care provider.  Tell all your health care providers about any medicine reactions that you have had in the past.  If your rash was caused by sensitivity to sunlight, and while your rash is healing: ?  Avoid being in the sun if possible, especially when it is strongest, usually between 10 a.m. and 4 p.m. ? Cover your skin with pants, long sleeves, and a hat when you are exposed to sunlight.  If you have hives: ? Take a cool shower or use a cool compress to relieve itchiness. ? Take over-the-counter antihistamines, as recommended by your health care provider, until the hives are gone. Hives are not contagious.  Keep all follow-up visits as told by your health care provider. This is important. Contact a health care provider if you have:  A fever.  A rash that is not going away.  A rash that gets worse.  A rash that comes back.  Wheezing or coughing. Get help right away if:  You start to have breathing problems.  You start to have shortness of breath.  Your face or throat starts to swell.  You have severe weakness with dizziness or fainting.  You have chest pain. These symptoms may represent a serious problem that is an emergency. Do not wait to see if the symptoms will go away. Get medical help right away. Call your local emergency services (911 in the [place] Do not drive yourself to the hospital. Summary  A drug rash occurs when a medicine causes a change in the color or texture of the skin. The rash may appear on a small area of skin or all over your body.  It can develop minutes, hours, or days after you take the medicine.  Your health care   provider will do various tests to determine what medicine caused your rash.  The rash may be treated with medicine to relieve itching, swelling, and pain. This information is not intended to replace advice given to you by your health care provider. Make sure you discuss any questions you have with your health care provider. Document Released: 12/05/2004 Document Revised: 09/18/2017 Document Reviewed: 09/18/2017 Elsevier Interactive Patient Education  2019 [employer]  

## 2018-11-26 NOTE — Progress Notes (Signed)
Subjective:    Patient ID: Anna [name], female    DOB: Jan 05, 1986, 33 y.o.   MRN: [id number]  HPI  Pt presents to the clinic today with c/o a rash. She reports this started 3 days ago. The rash is located all over her body. It itches and burns. She has some tightness in her throat but denies swelling of the eyes, lips or tongue. She has allergies to shellfish and dogs, and reports she has recently been around her dog. She also ate seafood last week. She was also recently started on Augmentin 5 days ago for a sinus infection but is not sure if this is related. She has been taking Benadryl with minimal relief.  Review of Systems  Past Medical History:  Diagnosis Date  . Acne 06/15/2015  . Anxiety   . Anxiety and depression 06/15/2015  . Subchorionic hemorrhage in first trimester     Current Outpatient Medications  Medication Sig Dispense Refill  . loratadine (CLARITIN) 10 MG tablet Take 10 mg by mouth daily.    . montelukast (SINGULAIR) 10 MG tablet      No current facility-administered medications for this visit.     Allergies  Allergen Reactions  . Lexapro [Escitalopram] Rash    Family History  Problem Relation Age of Onset  . Other Neg Hx     Social History   Socioeconomic History  . Marital status: Married    Spouse name: Not on file  . Number of children: Not on file  . Years of education: Not on file  . Highest education level: Not on file  Occupational History  . Not on file  Social Needs  . Financial resource strain: Not on file  . Food insecurity:    Worry: Not on file    Inability: Not on file  . Transportation needs:    Medical: Not on file    Non-medical: Not on file  Tobacco Use  . Smoking status: Never Smoker  . Smokeless tobacco: Never Used  Substance and Sexual Activity  . Alcohol use: No    Alcohol/week: 0.0 standard drinks    Frequency: Never    Comment: occasional  . Drug use: No  . Sexual activity: Not Currently    Birth  control/protection: None, Surgical    Comment: spouse had vasectomy  Lifestyle  . Physical activity:    Days per week: Not on file    Minutes per session: Not on file  . Stress: Not on file  Relationships  . Social connections:    Talks on phone: Not on file    Gets together: Not on file    Attends religious service: Not on file    Active member of club or organization: Not on file    Attends meetings of clubs or organizations: Not on file    Relationship status: Not on file  . Intimate partner violence:    Fear of current or ex partner: Not on file    Emotionally abused: Not on file    Physically abused: Not on file    Forced sexual activity: Not on file  Other Topics Concern  . Not on file  Social History Narrative  . Not on file     Constitutional: Denies fever, malaise, fatigue, headache or abrupt weight changes.  Skin: Pt reports rash. Denies lesions or ulcercations.    No other specific complaints in a complete review of systems (except as listed in HPI above).     Objective:  Physical Exam   BP 106/68   Pulse 72   Temp 98 F (36.7 C) (Oral)   Wt 124 lb (56.2 kg)   LMP 11/23/2018   SpO2 98%   BMI 20.96 kg/m  Wt Readings from Last 3 Encounters:  11/26/18 124 lb (56.2 kg)  08/28/18 121 lb (54.9 kg)  05/28/18 127 lb (57.6 kg)    General: Appears her stated age, well developed, well nourished in NAD. Skin: Scattered maculopapular lesions noted at base of scalp, behind ears, on lower arms, abdomen, back and thighs. HEENT:  Throat/Mouth: Teeth present, mucosa pink and moist, no exudate, lesions or ulcerations noted.  Neck:  Neck supple, trachea midline. No masses, lumps or thyromegaly present.  Pulmonary/Chest: Normal effort and positive vesicular breath sounds. No respiratory distress. No wheezes, rales or ronchi noted.   BMET    Component Value Date/Time   NA 135 11/17/2012 1845   K 3.3 (L) 11/17/2012 1845   CL 98 11/17/2012 1845   CO2 22 11/17/2012  1845   GLUCOSE 180 (H) 11/17/2012 1845   BUN 18 11/17/2012 1845   CREATININE 0.62 11/17/2012 1845   CALCIUM 10.6 (H) 11/17/2012 1845   GFRNONAA >90 11/17/2012 1845   GFRAA >90 11/17/2012 1845    Lipid Panel  No results found for: CHOL, TRIG, HDL, CHOLHDL, VLDL, LDLCALC  CBC    Component Value Date/Time   WBC 12.8 (H) 04/24/2017 0508   RBC 3.28 (L) 04/24/2017 0508   HGB 9.6 (L) 04/24/2017 0508   HGB 12.2 01/17/2017 0832   HCT 28.4 (L) 04/24/2017 0508   HCT 37.4 01/17/2017 0832   PLT 170 04/24/2017 0508   PLT 215 01/17/2017 0832   MCV 86.6 04/24/2017 0508   MCV 91 01/17/2017 0832   MCH 29.3 04/24/2017 0508   MCHC 33.8 04/24/2017 0508   RDW 14.0 04/24/2017 0508   RDW 13.6 01/17/2017 0832   LYMPHSABS 2.2 04/23/2017 1500   MONOABS 0.7 04/23/2017 1500   EOSABS 0.1 04/23/2017 1500   BASOSABS 0.0 04/23/2017 1500    Hgb A1C No results found for: HGBA1C         Assessment & Plan:   Medication Reaction to PCN:  Stop Augmentin RX for Pred Tapers 6 days Continue Benadryl as needed Follow up with [facility] as needed Work note provided  Return precautions discussed Nicki Reaperegina Imanol Bihl, NP

## 2018-12-15 ENCOUNTER — Encounter: Payer: Self-pay | Admitting: Internal Medicine

## 2018-12-23 ENCOUNTER — Encounter: Payer: Self-pay | Admitting: Family Medicine

## 2018-12-23 ENCOUNTER — Ambulatory Visit (INDEPENDENT_AMBULATORY_CARE_PROVIDER_SITE_OTHER): Payer: No Typology Code available for payment source | Admitting: Family Medicine

## 2018-12-23 VITALS — BP 100/78 | HR 71 | Temp 97.7°F | Resp 20 | Ht 64.5 in | Wt 124.5 lb

## 2018-12-23 DIAGNOSIS — J029 Acute pharyngitis, unspecified: Secondary | ICD-10-CM

## 2018-12-23 LAB — POCT RAPID STREP A (OFFICE): Rapid Strep A Screen: NEGATIVE

## 2018-12-23 NOTE — Progress Notes (Signed)
Subjective:     Anna [name] is a 33 y.o. female presenting for Sore Throat (symptoms started on 12/20/2018-had body aches, ear pain, weakness feeling. She started taking Zycam at that time. Then felt worse on 12/21/2018. Started to have sweats, burning in her throat, just feeling bad. Tylenol, Ibuprofen, OTC cold medicine for flu like/cold symptoms, Flonase. No fever. But feels hot on inside. Mostly sore throat hurting.)     Sore Throat   This is a new problem. The problem has been gradually worsening. There has been no fever. The pain is severe. Associated symptoms include ear pain. Pertinent negatives include no abdominal pain, congestion, coughing, diarrhea, ear discharge, hoarse voice, shortness of breath, trouble swallowing or vomiting. She has had exposure to strep. She has had no exposure to mono. She has tried acetaminophen and NSAIDs (cold medicine) for the symptoms. The treatment provided mild relief.     Review of Systems  Constitutional: Positive for chills and fatigue. Negative for fever.  HENT: Positive for ear pain, postnasal drip and sore throat. Negative for congestion, ear discharge, hoarse voice, sinus pressure, sinus pain and trouble swallowing.   Respiratory: Negative for cough and shortness of breath.   Gastrointestinal: Negative for abdominal pain, diarrhea, nausea and vomiting.       Some discomfort  Musculoskeletal: Positive for arthralgias and myalgias.  Psychiatric/Behavioral: The patient is nervous/anxious.      Social History   Tobacco Use  Smoking Status Never Smoker  Smokeless Tobacco Never Used        Objective:    BP Readings from Last 3 Encounters:  12/23/18 100/78  11/26/18 106/68  08/28/18 114/70   Wt Readings from Last 3 Encounters:  12/23/18 124 lb 8 oz (56.5 kg)  11/26/18 124 lb (56.2 kg)  08/28/18 121 lb (54.9 kg)    BP 100/78   Pulse 71   Temp 97.7 F (36.5 C)   Resp 20   Ht 5' 4.5" (1.638 m)   Wt 124 lb 8 oz (56.5 kg)    LMP 12/20/2018   SpO2 99%   Breastfeeding No   BMI 21.04 kg/m    Physical Exam Constitutional:      General: She is not in acute distress.    Appearance: She is well-developed. She is not diaphoretic.  HENT:     Head: Normocephalic and atraumatic.     Right Ear: Ear canal normal. No middle ear effusion. Tympanic membrane is scarred. Tympanic membrane is not erythematous, retracted or bulging.     Left Ear: Tympanic membrane and ear canal normal.     Nose: Mucosal edema and rhinorrhea present.     Right Sinus: No maxillary sinus tenderness or frontal sinus tenderness.     Left Sinus: No maxillary sinus tenderness or frontal sinus tenderness.     Mouth/Throat:     Pharynx: Uvula midline. Posterior oropharyngeal erythema present. No oropharyngeal exudate.     Tonsils: Swelling: 0 on the right. 0 on the left.  Eyes:     General: No scleral icterus.    Conjunctiva/sclera: Conjunctivae normal.  Neck:     Musculoskeletal: Neck supple.  Cardiovascular:     Rate and Rhythm: Normal rate and regular rhythm.     Heart sounds: Normal heart sounds. No murmur.  Pulmonary:     Effort: Pulmonary effort is normal. No respiratory distress.     Breath sounds: Normal breath sounds.  Lymphadenopathy:     Cervical: No cervical adenopathy.  Skin:  General: Skin is warm and dry.     Capillary Refill: Capillary refill takes less than 2 seconds.  Neurological:     Mental Status: She is alert.      Rapid Strep: negative     Assessment & Plan:   Problem List Items Addressed This Visit    None    Visit Diagnoses    Sore throat    -  Primary   Relevant Orders   POCT rapid strep A     Discussed that likely viral and should get better in 7 days.   Also recommended trying antiacids to see if that helps the lower chest burning symptoms  Return if symptoms worsen or fail to improve.  Lynnda Child, MD

## 2018-12-23 NOTE — Patient Instructions (Addendum)
Based on your symptoms, it looks like you have a virus.   Antibiotics are not need for a viral infection but the following will help:   1. Drink plenty of fluids 2. Get lots of rest  Sinus Congestion 1) Neti Pot (Saline rinse) -- 2 times day -- if tolerated 2) Flonase (Store Brand ok) - once daily 3) Over the counter congestion medications  Cough 1) Cough drops can be helpful 2) Nyquil (or nighttime cough medication) 3) Honey is proven to be one of the best cough medications   Sore Throat 1) Honey as above, cough drops 2) Ibuprofen or Aleve can be helpful 3) Salt water Gargles  Burning sensation 1) Try over the counter Maalox or Tums  2) Can also try pepto-bismal to see if that helps   If you develop fevers (Temperature >100.4), chills, worsening symptoms or symptoms lasting longer than 10 days return to clinic.

## 2019-01-08 ENCOUNTER — Ambulatory Visit (INDEPENDENT_AMBULATORY_CARE_PROVIDER_SITE_OTHER): Payer: No Typology Code available for payment source | Admitting: Internal Medicine

## 2019-01-08 ENCOUNTER — Encounter: Payer: Self-pay | Admitting: Internal Medicine

## 2019-01-08 VITALS — BP 106/68 | HR 76 | Temp 98.2°F | Wt 122.0 lb

## 2019-01-08 DIAGNOSIS — J00 Acute nasopharyngitis [common cold]: Secondary | ICD-10-CM | POA: Diagnosis not present

## 2019-01-08 MED ORDER — AZITHROMYCIN 250 MG PO TABS: ORAL_TABLET | ORAL | 0 refills | Status: DC

## 2019-01-08 NOTE — Progress Notes (Signed)
Subjective:    Patient ID: Anna [name], female    DOB: 1986-08-15, 33 y.o.   MRN: [id number]  HPI  Pt presents to the clinic today with c/o sore throat and cough. She reports this started over 2 weeks ago. She is not having any difficulty swallowing. The cough is mostly non productive. She denies shortness of breath but does have a burning sensation in her chest when she coughs. She denies runny nose, nasal congestion, or ear pain. She denies fever, chills or body aches. She was seen 2/12 for the same. She was diagnosed with a viral URI with cough. She has tried Flonase ,Claritin, Robitussin, Tylenol Cold, Zicam and Ibuprofen without any relief. She has had sick contacts.  Review of Systems      Past Medical History:  Diagnosis Date  . Acne 06/15/2015  . Anxiety   . Anxiety and depression 06/15/2015  . Subchorionic hemorrhage in first trimester     Current Outpatient Medications  Medication Sig Dispense Refill  . Cholecalciferol (VITAMIN D3 PO) Take 1 tablet by mouth daily.    Marland Kitchen loratadine (CLARITIN) 10 MG tablet Take 10 mg by mouth daily.    . montelukast (SINGULAIR) 10 MG tablet     . Probiotic Product (PROBIOTIC DAILY) CAPS Take by mouth.    Marland Kitchen azithromycin (ZITHROMAX) 250 MG tablet Take 2 tabs today, then 1 tab daily x 4 days 6 tablet 0   No current facility-administered medications for this visit.     Allergies  Allergen Reactions  . Augmentin [Amoxicillin-Pot Clavulanate] Hives    Rash, itching  . Lexapro [Escitalopram] Rash    Family History  Problem Relation Age of Onset  . Other Neg Hx     Social History   Socioeconomic History  . Marital status: Married    Spouse name: Not on file  . Number of children: Not on file  . Years of education: Not on file  . Highest education level: Not on file  Occupational History  . Not on file  Social Needs  . Financial resource strain: Not on file  . Food insecurity:    Worry: Not on file    Inability: Not on file  .  Transportation needs:    Medical: Not on file    Non-medical: Not on file  Tobacco Use  . Smoking status: Never Smoker  . Smokeless tobacco: Never Used  Substance and Sexual Activity  . Alcohol use: No    Alcohol/week: 0.0 standard drinks    Frequency: Never    Comment: occasional  . Drug use: No  . Sexual activity: Not Currently    Birth control/protection: None, Surgical    Comment: spouse had vasectomy  Lifestyle  . Physical activity:    Days per week: Not on file    Minutes per session: Not on file  . Stress: Not on file  Relationships  . Social connections:    Talks on phone: Not on file    Gets together: Not on file    Attends religious service: Not on file    Active member of club or organization: Not on file    Attends meetings of clubs or organizations: Not on file    Relationship status: Not on file  . Intimate partner violence:    Fear of current or ex partner: Not on file    Emotionally abused: Not on file    Physically abused: Not on file    Forced sexual activity: Not on file  Other Topics Concern  . Not on file  Social History Narrative  . Not on file     Constitutional: Denies fever, malaise, fatigue, headache or abrupt weight changes.  HEENT: Pt reports sore throat. Denies eye pain, eye redness, ear pain, ringing in the ears, wax buildup, runny nose, nasal congestion, bloody nose. Respiratory: Pt reports cough. Denies difficulty breathing, shortness of breath,  or sputum production.  Cardiovascular: Denies chest pain, chest tightness, palpitations or swelling in the hands or feet.   No other specific complaints in a complete review of systems (except as listed in HPI above).  Objective:   Physical Exam   BP 106/68   Pulse 76   Temp 98.2 F (36.8 C) (Oral)   Wt 122 lb (55.3 kg)   LMP 12/20/2018   SpO2 98%   BMI 20.62 kg/m  Wt Readings from Last 3 Encounters:  01/08/19 122 lb (55.3 kg)  12/23/18 124 lb 8 oz (56.5 kg)  11/26/18 124 lb (56.2  kg)    General: Appears her stated age, well developed, well nourished in NAD. HEENT: Head: normal shape and size, no sinus tenderness noted; Ears: Tm's gray and intact, normal light reflex; Nose: mucosa pink and moist, septum midline; Throat/Mouth: Teeth present, mucosa pink and moist, + PND, no exudate, lesions or ulcerations noted.  Neck:  No adenopathy noted. Cardiovascular: Normal rate and rhythm. S1,S2 noted.  No murmur, rubs or gallops noted.  Pulmonary/Chest: Normal effort and positive vesicular breath sounds. No respiratory distress. No wheezes, rales or ronchi noted.   BMET    Component Value Date/Time   NA 135 11/17/2012 1845   K 3.3 (L) 11/17/2012 1845   CL 98 11/17/2012 1845   CO2 22 11/17/2012 1845   GLUCOSE 180 (H) 11/17/2012 1845   BUN 18 11/17/2012 1845   CREATININE 0.62 11/17/2012 1845   CALCIUM 10.6 (H) 11/17/2012 1845   GFRNONAA >90 11/17/2012 1845   GFRAA >90 11/17/2012 1845    Lipid Panel  No results found for: CHOL, TRIG, HDL, CHOLHDL, VLDL, LDLCALC  CBC    Component Value Date/Time   WBC 12.8 (H) 04/24/2017 0508   RBC 3.28 (L) 04/24/2017 0508   HGB 9.6 (L) 04/24/2017 0508   HGB 12.2 01/17/2017 0832   HCT 28.4 (L) 04/24/2017 0508   HCT 37.4 01/17/2017 0832   PLT 170 04/24/2017 0508   PLT 215 01/17/2017 0832   MCV 86.6 04/24/2017 0508   MCV 91 01/17/2017 0832   MCH 29.3 04/24/2017 0508   MCHC 33.8 04/24/2017 0508   RDW 14.0 04/24/2017 0508   RDW 13.6 01/17/2017 0832   LYMPHSABS 2.2 04/23/2017 1500   MONOABS 0.7 04/23/2017 1500   EOSABS 0.1 04/23/2017 1500   BASOSABS 0.0 04/23/2017 1500    Hgb A1C No results found for: HGBA1C         Assessment & Plan:   Upper Respiratory Infection:  Get some rest and drink plenty of fluids Given duration of symptoms, will treat with Azithromycin 250 mg x 5 days Continue Flonase Change Claritin to Allegra Continue Robitussin as needed for cough  Return precautions discussed Nicki Reaper, NP

## 2019-01-08 NOTE — Patient Instructions (Signed)

## 2019-01-09 ENCOUNTER — Telehealth: Payer: No Typology Code available for payment source | Admitting: Physician Assistant

## 2019-01-09 DIAGNOSIS — R6889 Other general symptoms and signs: Secondary | ICD-10-CM

## 2019-01-09 MED ORDER — OSELTAMIVIR PHOSPHATE 75 MG PO CAPS: 75.0000 mg | ORAL_CAPSULE | Freq: Two times a day (BID) | ORAL | 0 refills | Status: DC

## 2019-01-09 NOTE — Progress Notes (Signed)
Message sent to patient for more information as she is endorsing symptom onset today but was just seen by PCP yesterday for 2 weeks of respiratory symptoms and placed on Z-pack. Need to assess concern for flu versus atypical pneumonia.   Awaiting patient response.

## 2019-01-09 NOTE — Progress Notes (Signed)

## 2019-01-09 NOTE — Progress Notes (Signed)
I have spent 5 minutes in review of e-visit questionnaire, review and updating patient chart, medical decision making and response to patient.   Devoiry Corriher Cody Markies Mowatt, PA-C    

## 2019-04-30 ENCOUNTER — Encounter: Payer: Self-pay | Admitting: Internal Medicine

## 2019-05-21 ENCOUNTER — Encounter: Payer: Self-pay | Admitting: Internal Medicine

## 2019-05-21 ENCOUNTER — Other Ambulatory Visit: Payer: Self-pay

## 2019-05-21 ENCOUNTER — Ambulatory Visit (INDEPENDENT_AMBULATORY_CARE_PROVIDER_SITE_OTHER): Payer: No Typology Code available for payment source | Admitting: Internal Medicine

## 2019-05-21 VITALS — BP 98/64 | HR 73 | Temp 99.6°F | Ht 64.5 in | Wt 122.8 lb

## 2019-05-21 DIAGNOSIS — L700 Acne vulgaris: Secondary | ICD-10-CM

## 2019-05-21 MED ORDER — SPIRONOLACTONE 25 MG PO TABS: 25.0000 mg | ORAL_TABLET | Freq: Every day | ORAL | 2 refills | Status: DC

## 2019-05-21 NOTE — Progress Notes (Signed)
Subjective:    Patient ID: Anna [name], female    DOB: 09/29/1986, 33 y.o.   MRN: [id number]  HPI  Pt presents to the clinic today with c/o acne on her face and back. This has been an ongoing issue for her. The acne gets worse around the time of her menses. She has tried multiple OTC products with minimal relief. She has really changed her diet to cut out greasy foods, chocolate and soda. She has not seen a dermatologist for this in the past.  Review of Systems  Past Medical History:  Diagnosis Date  . Acne 06/15/2015  . Anxiety   . Anxiety and depression 06/15/2015  . Subchorionic hemorrhage in first trimester     Current Outpatient Medications  Medication Sig Dispense Refill  . Cholecalciferol (VITAMIN D3 PO) Take 1 tablet by mouth daily.    Marland Kitchen loratadine (CLARITIN) 10 MG tablet Take 10 mg by mouth daily.    Marland Kitchen spironolactone (ALDACTONE) 25 MG tablet Take 1 tablet (25 mg total) by mouth daily. 30 tablet 2   No current facility-administered medications for this visit.     Allergies  Allergen Reactions  . Augmentin [Amoxicillin-Pot Clavulanate] Hives    Rash, itching  . Lexapro [Escitalopram] Rash    Family History  Problem Relation Age of Onset  . Other Neg Hx     Social History   Socioeconomic History  . Marital status: Married    Spouse name: Not on file  . Number of children: Not on file  . Years of education: Not on file  . Highest education level: Not on file  Occupational History  . Not on file  Social Needs  . Financial resource strain: Not on file  . Food insecurity    Worry: Not on file    Inability: Not on file  . Transportation needs    Medical: Not on file    Non-medical: Not on file  Tobacco Use  . Smoking status: Never Smoker  . Smokeless tobacco: Never Used  Substance and Sexual Activity  . Alcohol use: No    Alcohol/week: 0.0 standard drinks    Frequency: Never    Comment: occasional  . Drug use: No  . Sexual activity: Not Currently   Birth control/protection: None, Surgical    Comment: spouse had vasectomy  Lifestyle  . Physical activity    Days per week: Not on file    Minutes per session: Not on file  . Stress: Not on file  Relationships  . Social Herbalist on phone: Not on file    Gets together: Not on file    Attends religious service: Not on file    Active member of club or organization: Not on file    Attends meetings of clubs or organizations: Not on file    Relationship status: Not on file  . Intimate partner violence    Fear of current or ex partner: Not on file    Emotionally abused: Not on file    Physically abused: Not on file    Forced sexual activity: Not on file  Other Topics Concern  . Not on file  Social History Narrative  . Not on file     Constitutional: Denies fever, malaise, fatigue, headache or abrupt weight changes.  Respiratory: Denies difficulty breathing, shortness of breath, cough or sputum production.   Cardiovascular: Denies chest pain, chest tightness, palpitations or swelling in the hands or feet.  Skin: Pt reports acne  on face and back. Denies rashes, or ulcercations.    No other specific complaints in a complete review of systems (except as listed in HPI above).     Objective:   Physical Exam   BP 98/64 (BP Location: Left Arm, Patient Position: Sitting, Cuff Size: Normal)   Pulse 73   Temp 99.6 F (37.6 C) (Temporal)   Ht 5' 4.5" (1.638 m)   Wt 122 lb 12.8 oz (55.7 kg)   SpO2 96%   BMI 20.75 kg/m  Wt Readings from Last 3 Encounters:  05/21/19 122 lb 12.8 oz (55.7 kg)  01/08/19 122 lb (55.3 kg)  12/23/18 124 lb 8 oz (56.5 kg)    General: Appears her stated age, well developed, well nourished in NAD. Skin: Warm, dry and intact. Cystic acne noted on face and upper back. Cardiovascular: Normal rate and rhythm.  Pulmonary/Chest: Normal effort and positive vesicular breath sounds. No respiratory distress. No wheezes, rales or ronchi noted.   Neurological: Alert and oriented.    BMET    Component Value Date/Time   NA 135 11/17/2012 1845   K 3.3 (L) 11/17/2012 1845   CL 98 11/17/2012 1845   CO2 22 11/17/2012 1845   GLUCOSE 180 (H) 11/17/2012 1845   BUN 18 11/17/2012 1845   CREATININE 0.62 11/17/2012 1845   CALCIUM 10.6 (H) 11/17/2012 1845   GFRNONAA >90 11/17/2012 1845   GFRAA >90 11/17/2012 1845    Lipid Panel  No results found for: CHOL, TRIG, HDL, CHOLHDL, VLDL, LDLCALC  CBC    Component Value Date/Time   WBC 12.8 (H) 04/24/2017 0508   RBC 3.28 (L) 04/24/2017 0508   HGB 9.6 (L) 04/24/2017 0508   HGB 12.2 01/17/2017 0832   HCT 28.4 (L) 04/24/2017 0508   HCT 37.4 01/17/2017 0832   PLT 170 04/24/2017 0508   PLT 215 01/17/2017 0832   MCV 86.6 04/24/2017 0508   MCV 91 01/17/2017 0832   MCH 29.3 04/24/2017 0508   MCHC 33.8 04/24/2017 0508   RDW 14.0 04/24/2017 0508   RDW 13.6 01/17/2017 0832   LYMPHSABS 2.2 04/23/2017 1500   MONOABS 0.7 04/23/2017 1500   EOSABS 0.1 04/23/2017 1500   BASOSABS 0.0 04/23/2017 1500    Hgb A1C No results found for: HGBA1C        Assessment & Plan:   Cystic Acne:  BMET today If kidney function normal, start Spironolactone 25 mg daily Discussed the possibility of potassium retention and to avoid too much potassium containing foods She does not use a loofah If no improvement, consider dermatology referral  Return precautions discussed Nicki Reaperegina Shakemia Madera, NP

## 2019-05-21 NOTE — Patient Instructions (Signed)
Spironolactone tablets What is this medicine? SPIRONOLACTONE ([place]er on [place] LAK tone) is a diuretic. It helps you make more urine and to lose excess water from your body. This medicine is used to treat high blood pressure, and edema or swelling from heart, kidney, or liver disease. It is also used to treat patients who make too much aldosterone or have low potassium. This medicine may be used for other purposes; ask your health care provider or pharmacist if you have questions. COMMON BRAND NAME(S): Aldactone What should I tell my health care provider before I take this medicine? They need to know if you have any of these conditions:  high blood level of potassium  kidney disease or trouble making urine  liver disease  an unusual or allergic reaction to spironolactone, other medicines, foods, dyes, or preservatives  pregnant or trying to get pregnant  breast-feeding How should I use this medicine? Take this medicine by mouth with a drink of water. Follow the directions on your prescription label. You can take it with or without food. If it upsets your stomach, take it with food. Do not take your medicine more often than directed. Remember that you will need to pass more urine after taking this medicine. Do not take your doses at a time of day that will cause you problems. Do not take at bedtime. Talk to your pediatrician regarding the use of this medicine in children. While this drug may be prescribed for selected conditions, precautions do apply. Overdosage: If you think you have taken too much of this medicine contact a poison control center or emergency room at once. NOTE: This medicine is only for you. Do not share this medicine with others. What if I miss a dose? If you miss a dose, take it as soon as you can. If it is almost time for your next dose, take only that dose. Do not take double or extra doses. What may interact with this medicine? Do not take this medicine with any of the  following medications:  cidofovir  eplerenone  tranylcypromine This medicine may also interact with the following medications:  aspirin  certain medicines for blood pressure or heart disease like benazepril, lisinopril, losartan, valsartan  certain medicines that treat or prevent blood clots like heparin and enoxaparin  cholestyramine  cyclosporine  digoxin  lithium  medicines that relax muscles for surgery  NSAIDs, medicines for pain and inflammation, like ibuprofen or naproxen  other diuretics  potassium supplements  steroid medicines like prednisone or cortisone  trimethoprim This list may not describe all possible interactions. Give your health care provider a list of all the medicines, herbs, non-prescription drugs, or dietary supplements you use. Also tell them if you smoke, drink alcohol, or use illegal drugs. Some items may interact with your medicine. What should I watch for while using this medicine? Visit your doctor or health care professional for regular checks on your progress. Check your blood pressure as directed. Ask your doctor what your blood pressure should be, and when you should contact them. You may need to be on a special diet while taking this medicine. Ask your doctor. Also, ask how many glasses of fluid you need to drink a day. You must not get dehydrated. This medicine may make you feel confused, dizzy or lightheaded. Drinking alcohol and taking some medicines can make this worse. Do not drive, use machinery, or do anything that needs mental alertness until you know how this medicine affects you. Do not sit or   stand up quickly. What side effects may I notice from receiving this medicine? Side effects that you should report to your doctor or health care professional as soon as possible:  allergic reactions such as skin rash or itching, hives, swelling of the lips, mouth, tongue, or throat  black or tarry stools  fast, irregular  heartbeat  fever  muscle pain, cramps  numbness, tingling in hands or feet  trouble breathing  trouble passing urine  unusual bleeding  unusually weak or tired Side effects that usually do not require medical attention (report to your doctor or health care professional if they continue or are bothersome):  change in voice or hair growth  confusion  dizzy, drowsy  dry mouth, increased thirst  enlarged or tender breasts  headache  irregular menstrual periods  sexual difficulty, unable to have an erection  stomach upset This list may not describe all possible side effects. Call your doctor for medical advice about side effects. You may report side effects to FDA at 1-800-FDA-1088. Where should I keep my medicine? Keep out of the reach of children. Store below 25 degrees C (77 degrees F). Throw away any unused medicine after the expiration date. NOTE: This sheet is a summary. It may not cover all possible information. If you have questions about this medicine, talk to your doctor, pharmacist, or health care provider.  2020 Elsevier/Gold Standard (2017-03-14 09:42:28)  

## 2019-05-22 LAB — BASIC METABOLIC PANEL
BUN: 19 mg/dL (ref 7–25)
CO2: 29 mmol/L (ref 20–32)
Calcium: 10.3 mg/dL — ABNORMAL HIGH (ref 8.6–10.2)
Chloride: 104 mmol/L (ref 98–110)
Creat: 0.68 mg/dL (ref 0.50–1.10)
Glucose, Bld: 104 mg/dL — ABNORMAL HIGH (ref 65–99)
Potassium: 4.4 mmol/L (ref 3.5–5.3)
Sodium: 138 mmol/L (ref 135–146)

## 2019-05-24 ENCOUNTER — Encounter: Payer: Self-pay | Admitting: Internal Medicine

## 2019-05-31 ENCOUNTER — Other Ambulatory Visit: Payer: Self-pay

## 2019-05-31 DIAGNOSIS — Z20822 Contact with and (suspected) exposure to covid-19: Secondary | ICD-10-CM

## 2019-06-03 LAB — NOVEL CORONAVIRUS, NAA: SARS-CoV-2, NAA: NOT DETECTED

## 2019-08-15 ENCOUNTER — Other Ambulatory Visit: Payer: Self-pay

## 2019-08-15 ENCOUNTER — Encounter (HOSPITAL_COMMUNITY): Payer: Self-pay

## 2019-08-15 ENCOUNTER — Ambulatory Visit (HOSPITAL_COMMUNITY)
Admission: EM | Admit: 2019-08-15 | Discharge: 2019-08-15 | Disposition: A | Discharge status: Home / Self Care | Payer: No Typology Code available for payment source | Attending: Family Medicine | Admitting: Family Medicine

## 2019-08-15 DIAGNOSIS — Z20822 Contact with and (suspected) exposure to covid-19: Secondary | ICD-10-CM

## 2019-08-15 DIAGNOSIS — Z20828 Contact with and (suspected) exposure to other viral communicable diseases: Secondary | ICD-10-CM | POA: Diagnosis present

## 2019-08-15 NOTE — ED Triage Notes (Signed)
Pt wants to be tested for Covid 

## 2019-08-15 NOTE — ED Provider Notes (Signed)
[facility]    CSN: [id number] Arrival date & time: 08/15/19  1105      History   Chief Complaint Chief Complaint  Patient presents with  . covid test    HPI Anna [name] is a 33 y.o. female.   Anna [name] presents with requests for covid screening. Denies any symptoms. No chest pain , headache, shortness of breath , cough, uri symptoms, gi symptoms. Her son has had congestion. No known ill contacts. No recent travel.     ROS per HPI, negative if not otherwise mentioned.      Past Medical History:  Diagnosis Date  . Acne 06/15/2015  . Anxiety   . Anxiety and depression 06/15/2015  . Subchorionic hemorrhage in first trimester     Patient Active Problem List   Diagnosis Date Noted  . Chronic right shoulder pain 04/21/2018    History reviewed. No pertinent surgical history.  OB History    Gravida  2   Para  2   Term  2   Preterm  0   AB  0   Living  2     SAB  0   TAB  0   Ectopic  0   Multiple  0   Live Births  2            Home Medications    Prior to Admission medications   Medication Sig Start Date End Date Taking? Authorizing Provider  Cholecalciferol (VITAMIN D3 PO) Take 1 tablet by mouth daily.    [provider]  loratadine (CLARITIN) 10 MG tablet Take 10 mg by mouth daily.    [provider]  spironolactone (ALDACTONE) 25 MG tablet Take 1 tablet (25 mg total) by mouth daily. 05/21/19   Lorre Munroe, NP    Family History Family History  Problem Relation Age of Onset  . Other Neg Hx     Social History Social History   Tobacco Use  . Smoking status: Never Smoker  . Smokeless tobacco: Never Used  Substance Use Topics  . Alcohol use: No    Alcohol/week: 0.0 standard drinks    Frequency: Never    Comment: occasional  . Drug use: No     Allergies   Augmentin [amoxicillin-pot clavulanate] and Lexapro [escitalopram]   Review of Systems Review of Systems   Physical Exam Triage  Vital Signs ED Triage Vitals  Enc Vitals Group     BP 08/15/19 1212 119/79     Pulse --      Resp 08/15/19 1212 16     Temp 08/15/19 1212 98.5 F (36.9 C)     Temp Source 08/15/19 1212 Oral     SpO2 08/15/19 1212 100 %     Weight 08/15/19 1214 120 lb (54.4 kg)     Height --      Head Circumference --      Peak Flow --      Pain Score 08/15/19 1214 0     Pain Loc --      Pain Edu? --      Excl. in GC? --    No data found.  Updated Vital Signs BP 119/79 (BP Location: Right Arm)   Temp 98.5 F (36.9 C) (Oral)   Resp 16   Wt 120 lb (54.4 kg)   LMP 08/05/2019   SpO2 100%   BMI 20.28 kg/m   Visual Acuity Right Eye Distance:   Left Eye Distance:   Bilateral Distance:  Right Eye Near:   Left Eye Near:    Bilateral Near:     Physical Exam Constitutional:      General: She is not in acute distress.    Appearance: She is well-developed.  Cardiovascular:     Rate and Rhythm: Normal rate.  Pulmonary:     Effort: Pulmonary effort is normal.  Skin:    General: Skin is warm and dry.  Neurological:     Mental Status: She is alert and oriented to person, place, and time.      UC Treatments / Results  Labs (all labs ordered are listed, but only abnormal results are displayed) Labs Reviewed  NOVEL CORONAVIRUS, NAA (HOSP ORDER, SEND-OUT TO REF LAB; TAT 18-24 HRS)    EKG   Radiology No results found.  Procedures Procedures (including critical care time)  Medications Ordered in UC Medications - No data to display  Initial Impression / Assessment and Plan / UC Course  I have reviewed the triage vital signs and the nursing notes.  Pertinent labs & imaging results that were available during my care of the patient were reviewed by me and considered in my medical decision making (see chart for details).     Asymptomatic without any specific known ill contacts, requesting covid screening. Collected and pending. Will notify of any positive findings and if any  changes to treatment are needed.  Patient verbalized understanding and agreeable to plan.   Final Clinical Impressions(s) / UC Diagnoses   Final diagnoses:  Encounter for laboratory testing for COVID-19 virus     Discharge Instructions     Self isolate until results are back and negative.  Will notify you of any positive findings. You may monitor your results on your MyChart online as well.      ED Prescriptions    None     PDMP not reviewed this encounter.   Zigmund Gottron, NP 08/15/19 1335

## 2019-08-15 NOTE — Discharge Instructions (Signed)
Self isolate until results are back and negative.  °Will notify you of any positive findings. You may monitor your results on your MyChart online as well.   ° °

## 2019-08-16 ENCOUNTER — Encounter (HOSPITAL_COMMUNITY): Payer: Self-pay

## 2019-08-16 LAB — NOVEL CORONAVIRUS, NAA (HOSP ORDER, SEND-OUT TO REF LAB; TAT 18-24 HRS): SARS-CoV-2, NAA: NOT DETECTED

## 2019-09-10 ENCOUNTER — Encounter: Payer: Self-pay | Admitting: Internal Medicine

## 2019-09-10 MED ORDER — SPIRONOLACTONE 25 MG PO TABS: 25.0000 mg | ORAL_TABLET | Freq: Every day | ORAL | 0 refills | Status: DC

## 2019-11-01 ENCOUNTER — Encounter: Payer: Self-pay | Admitting: Internal Medicine

## 2019-11-04 ENCOUNTER — Other Ambulatory Visit: Payer: Self-pay | Admitting: Internal Medicine

## 2019-11-04 MED ORDER — PREDNISONE 10 MG PO TABS: ORAL_TABLET | ORAL | 0 refills | Status: DC

## 2019-11-04 MED ORDER — AZITHROMYCIN 250 MG PO TABS: ORAL_TABLET | ORAL | 0 refills | Status: DC

## 2019-11-14 ENCOUNTER — Encounter: Payer: Self-pay | Admitting: Emergency Medicine

## 2019-11-14 ENCOUNTER — Other Ambulatory Visit: Payer: Self-pay

## 2019-11-14 ENCOUNTER — Ambulatory Visit
Admission: EM | Admit: 2019-11-14 | Discharge: 2019-11-14 | Disposition: A | Discharge status: Home / Self Care | Payer: No Typology Code available for payment source | Attending: Emergency Medicine | Admitting: Emergency Medicine

## 2019-11-14 DIAGNOSIS — J029 Acute pharyngitis, unspecified: Secondary | ICD-10-CM | POA: Diagnosis not present

## 2019-11-14 DIAGNOSIS — Z20828 Contact with and (suspected) exposure to other viral communicable diseases: Secondary | ICD-10-CM

## 2019-11-14 DIAGNOSIS — R0982 Postnasal drip: Secondary | ICD-10-CM

## 2019-11-14 NOTE — Discharge Instructions (Signed)
Your COVID test is pending - it is important to quarantine / isolate at home until your results are back. °If you test positive and would like further evaluation for persistent or worsening symptoms, you may schedule an E-visit or virtual (video) visit throughout the [facility] MyChart app or website. ° °PLEASE NOTE: If you develop severe chest pain or shortness of breath please go to the ER or call [phone] for further evaluation --> DO NOT schedule electronic or virtual visits for this. °Please call our office for further guidance / recommendations as needed. °

## 2019-11-14 NOTE — ED Provider Notes (Signed)
[facility]    CSN: [id number] Arrival date & time: 11/14/19  1259      History   Chief Complaint Chief Complaint  Patient presents with  . Sore Throat    HPI Anna [name] is a 34 y.o. female with history of anxiety presenting for Covid testing due to mild sore throat for the last 2 to 3 days.  Patient works in a ophthalmology office: No known Covid patients or contacts.  Patient denies fever, difficulty breathing, swelling, decreased appetite.  Has not tried any for symptoms.  States she is concerned given global pandemic and that her son had elevated temperature for the last few days.   Past Medical History:  Diagnosis Date  . Acne 06/15/2015  . Anxiety   . Anxiety and depression 06/15/2015  . Subchorionic hemorrhage in first trimester     Patient Active Problem List   Diagnosis Date Noted  . Chronic right shoulder pain 04/21/2018    History reviewed. No pertinent surgical history.  OB History    Gravida  2   Para  2   Term  2   Preterm  0   AB  0   Living  2     SAB  0   TAB  0   Ectopic  0   Multiple  0   Live Births  2            Home Medications    Prior to Admission medications   Medication Sig Start Date End Date Taking? Authorizing Provider  Cholecalciferol (VITAMIN D3 PO) Take 1 tablet by mouth daily.    [provider]  loratadine (CLARITIN) 10 MG tablet Take 10 mg by mouth daily.    [provider]  spironolactone (ALDACTONE) 25 MG tablet Take 1 tablet (25 mg total) by mouth daily. 09/10/19   Lorre Munroe, NP    Family History Family History  Problem Relation Age of Onset  . Other Neg Hx     Social History Social History   Tobacco Use  . Smoking status: Never Smoker  . Smokeless tobacco: Never Used  Substance Use Topics  . Alcohol use: No    Alcohol/week: 0.0 standard drinks    Comment: occasional  . Drug use: No     Allergies   Augmentin [amoxicillin-pot clavulanate] and Lexapro  [escitalopram]   Review of Systems Review of Systems  Constitutional: Negative for fatigue and fever.  HENT: Positive for postnasal drip and sore throat. Negative for congestion, dental problem, ear pain, facial swelling, hearing loss, sinus pain, trouble swallowing and voice change.   Eyes: Negative for photophobia, pain and visual disturbance.  Respiratory: Negative for cough and shortness of breath.   Cardiovascular: Negative for chest pain and palpitations.  Gastrointestinal: Negative for diarrhea and vomiting.  Musculoskeletal: Negative for arthralgias and myalgias.  Neurological: Negative for dizziness and headaches.     Physical Exam Triage Vital Signs ED Triage Vitals  Enc Vitals Group     BP      Pulse      Resp      Temp      Temp src      SpO2      Weight      Height      Head Circumference      Peak Flow      Pain Score      Pain Loc      Pain Edu?  Excl. in [place]?    No data found.  Updated Vital Signs BP 131/89 (BP Location: Right Arm)   Pulse 70   Temp 97.7 F (36.5 C) (Temporal)   Resp 16   LMP 11/10/2019   SpO2 98%   Visual Acuity Right Eye Distance:   Left Eye Distance:   Bilateral Distance:    Right Eye Near:   Left Eye Near:    Bilateral Near:     Physical Exam Constitutional:      General: She is not in acute distress.    Appearance: She is well-developed and normal weight. She is not ill-appearing or diaphoretic.  HENT:     Head: Normocephalic and atraumatic.     Mouth/Throat:     Mouth: Mucous membranes are moist.     Pharynx: Oropharynx is clear. Uvula midline. No pharyngeal swelling, posterior oropharyngeal erythema or uvula swelling.     Tonsils: No tonsillar exudate. 1+ on the right. 1+ on the left.     Comments: Cobblestoning present Eyes:     General: No scleral icterus.    Conjunctiva/sclera: Conjunctivae normal.     Pupils: Pupils are equal, round, and reactive to light.  Cardiovascular:     Rate and Rhythm: Normal  rate.  Pulmonary:     Effort: Pulmonary effort is normal.  Musculoskeletal:     Cervical back: Neck supple.  Lymphadenopathy:     Cervical: No cervical adenopathy.  Skin:    Capillary Refill: Capillary refill takes less than 2 seconds.     Coloration: Skin is not jaundiced or pale.     Findings: No rash.  Neurological:     Mental Status: She is alert and oriented to person, place, and time.      UC Treatments / Results  Labs (all labs ordered are listed, but only abnormal results are displayed) Labs Reviewed  NOVEL CORONAVIRUS, NAA    EKG   Radiology No results found.  Procedures Procedures (including critical care time)  Medications Ordered in UC Medications - No data to display  Initial Impression / Assessment and Plan / UC Course  I have reviewed the triage vital signs and the nursing notes.  Pertinent labs & imaging results that were available during my care of the patient were reviewed by me and considered in my medical decision making (see chart for details).     Patient afebrile, nontoxic, with SpO2 98%.  Covid PCR pending.  Patient to quarantine until results are back.  We will continue supportive management.  Return precautions discussed, patient verbalized understanding and is agreeable to plan. Final Clinical Impressions(s) / UC Diagnoses   Final diagnoses:  Sore throat  Post-nasal drip     Discharge Instructions     Your COVID test is pending - it is important to quarantine / isolate at home until your results are back. If you test positive and would like further evaluation for persistent or worsening symptoms, you may schedule an E-visit or virtual (video) visit throughout the [facility] app or website.  PLEASE NOTE: If you develop severe chest pain or shortness of breath please go to the ER or call [id number] for further evaluation --> DO NOT schedule electronic or virtual visits for this. Please call our office for further guidance /  recommendations as needed.    ED Prescriptions    None     PDMP not reviewed this encounter.   Hall[place], [place], [place] 11/14/19 1636

## 2019-11-14 NOTE — ED Triage Notes (Signed)
Pt presents to [facility] for assessment of mild sore throat x 2-3 days ago.  With son being "off" wanted to be sure nothing like COVID is going on.

## 2019-11-15 LAB — NOVEL CORONAVIRUS, NAA: SARS-CoV-2, NAA: NOT DETECTED

## 2019-12-01 ENCOUNTER — Other Ambulatory Visit: Payer: Self-pay | Admitting: Internal Medicine

## 2019-12-19 ENCOUNTER — Encounter: Payer: Self-pay | Admitting: Internal Medicine

## 2019-12-20 ENCOUNTER — Telehealth: Payer: No Typology Code available for payment source | Admitting: Primary Care

## 2019-12-20 ENCOUNTER — Telehealth: Payer: Self-pay

## 2019-12-20 ENCOUNTER — Encounter: Payer: Self-pay | Admitting: Family Medicine

## 2019-12-20 ENCOUNTER — Telehealth (INDEPENDENT_AMBULATORY_CARE_PROVIDER_SITE_OTHER): Payer: No Typology Code available for payment source | Admitting: Family Medicine

## 2019-12-20 ENCOUNTER — Ambulatory Visit
Admission: RE | Admit: 2019-12-20 | Discharge: 2019-12-20 | Disposition: A | Discharge status: Home / Self Care | Payer: No Typology Code available for payment source | Source: Ambulatory Visit | Attending: Family Medicine | Admitting: Family Medicine

## 2019-12-20 VITALS — Ht 64.5 in | Wt 120.0 lb

## 2019-12-20 DIAGNOSIS — R1031 Right lower quadrant pain: Secondary | ICD-10-CM | POA: Diagnosis not present

## 2019-12-20 DIAGNOSIS — R140 Abdominal distension (gaseous): Secondary | ICD-10-CM

## 2019-12-20 MED ORDER — IOPAMIDOL (ISOVUE-300) INJECTION 61%
100.0000 mL | Freq: Once | INTRAVENOUS | Status: AC | PRN
  Administered 2019-12-20: 100 mL via INTRAVENOUS

## 2019-12-20 NOTE — Telephone Encounter (Signed)
Diane from [facility] called to report pt in today for CT abdomen. Findings of possible recent rupture of R ovarian cyst.   IMPRESSION: 1. Possible recent rupture of a right ovarian cyst with surrounding small to moderate amount of free pelvic fluid. If clinical concern of ovarian torsion, recommend further evaluation with Doppler pelvic Ultrasound.  Harlin Heys, NP has already spoke with the pt concerning the results.

## 2019-12-20 NOTE — Progress Notes (Signed)
Virtual Visit via Video Note  I connected with Anna [name] on 12/20/19 at 11:15 AM EST by a video enabled telemedicine application and verified that I am speaking with the correct person using two identifiers.  Location: Patient: at work Provider: [employer] Mila Merry Persons participating in virtual visit: patient, provider, NP student Luisa Hart   I discussed the limitations of evaluation and management by telemedicine and the availability of in person appointments. The patient expressed understanding and agreed to proceed.  History of Present Illness: Chief Complaint  Patient presents with  . Abdominal Pain    Pt c/o cramping/pain in lower right pelvis/groin. Sharp pain shooting down into leg. Stomach feels very bloated. Pt states that she has had this in the past but never this severe. Using heating pad, Ibuprofen PRN. Pt having some nausea from pain, lightheaded at times - pulsating pain makes her lightheaded. No fever or change in BM. Denies vomiting.    This is a 34 yo female who presents today with above cc. Awoke yesterday with some intermittent right lower quadrant pain and bloating. Pain was not constant yesterday, today it is constant, 7/10, sharp, stabbing, shooting, burning, Pain is worse with movement, makes her feel lightheaded and nauseous.  She denies any similar pain previously.  She denies constipation, diarrhea, dysuria, frequency. Last menstrual period about 3 weeks ago. Husband has had vasectomy.    Observations/Objective: Patient is alert and answers questions appropriately.  Visible skin is unremarkable.  She is standing.  Respirations are even and unlabored, no audible wheeze or witnessed cough.  Mood and affect are appropriate. Ht 5' 4.5" (1.638 m)   Wt 120 lb (54.4 kg)   BMI 20.28 kg/m  Wt Readings from Last 3 Encounters:  12/20/19 120 lb (54.4 kg)  08/15/19 120 lb (54.4 kg)  04/22/17 148 lb (67.1 kg)    Assessment and Plan: 1. Abdominal distension (gaseous) -  CT Abdomen Pelvis W Contrast  2. Right lower quadrant pain -Concern for appendicitis.  Discussed with patient.  We will send her for a stat CT of the abdomen and pelvis. - CT Abdomen Pelvis W Contrast   Olean Ree, FNP-BC   Primary Care at [facility], [facility]  12/20/2019 11:41 AM   Follow Up Instructions:    I discussed the assessment and treatment plan with the patient. The patient was provided an opportunity to ask questions and all were answered. The patient agreed with the plan and demonstrated an understanding of the instructions.   The patient was advised to call back or seek an in-person evaluation if the symptoms worsen or if the condition fails to improve as anticipated.   Emi Belfast, FNP

## 2019-12-22 ENCOUNTER — Other Ambulatory Visit: Payer: Self-pay | Admitting: Internal Medicine

## 2019-12-22 MED ORDER — ONDANSETRON HCL 4 MG PO TABS: 4.0000 mg | ORAL_TABLET | Freq: Three times a day (TID) | ORAL | 0 refills | Status: DC | PRN

## 2020-01-15 ENCOUNTER — Other Ambulatory Visit: Payer: Self-pay | Admitting: Internal Medicine

## 2020-01-15 MED ORDER — ONDANSETRON HCL 4 MG PO TABS: 4.0000 mg | ORAL_TABLET | Freq: Three times a day (TID) | ORAL | 2 refills | Status: DC | PRN

## 2020-01-17 ENCOUNTER — Encounter: Payer: Self-pay | Admitting: Internal Medicine

## 2020-01-18 MED FILL — PROAIR RESPICLICK INHAL PWD: 108 (90 BAS | 16 days supply | Qty: 1 | Fill #0

## 2020-03-16 ENCOUNTER — Telehealth: Payer: Self-pay | Admitting: Internal Medicine

## 2020-03-16 NOTE — Telephone Encounter (Signed)
Received a call from patient requsting a Referral to [facility] on [address] [phone] for back pain. She will call to schedule and I told her we would fax her Referral over to their office. She needs this for [employer]

## 2020-03-16 NOTE — Telephone Encounter (Signed)
Is this a new problem?  I have not seen her for back pain before. If new, she will need an OV to discuss

## 2020-03-17 ENCOUNTER — Encounter: Payer: Self-pay | Admitting: Internal Medicine

## 2020-03-17 NOTE — Telephone Encounter (Signed)
msg sent to pt

## 2020-03-28 ENCOUNTER — Ambulatory Visit (INDEPENDENT_AMBULATORY_CARE_PROVIDER_SITE_OTHER): Payer: No Typology Code available for payment source | Admitting: Internal Medicine

## 2020-03-28 ENCOUNTER — Other Ambulatory Visit: Payer: Self-pay

## 2020-03-28 ENCOUNTER — Encounter: Payer: Self-pay | Admitting: Internal Medicine

## 2020-03-28 VITALS — BP 118/76 | HR 77 | Temp 98.6°F | Wt 127.0 lb

## 2020-03-28 DIAGNOSIS — M549 Dorsalgia, unspecified: Secondary | ICD-10-CM

## 2020-03-28 DIAGNOSIS — G8929 Other chronic pain: Secondary | ICD-10-CM | POA: Diagnosis not present

## 2020-03-28 NOTE — Progress Notes (Signed)
Subjective:    Patient ID: Anna [name], female    DOB: 08-29-1986, 34 y.o.   MRN: [id number]  HPI  Pt presents to the clinic today with c/o back pain. This started about 4 years ago. She reports a constant, achy pain from her neck down to her back with an intermittent sharp, burning pain in her mid and low back. She denies numbness, tingling or weakness in her upper or lower extremities. She has not had xrays of her back. She has been seeing a chiropractor and will need a referral for the [employer]. She takes Ibuprofen and BC powder with some relief.  Review of Systems      Past Medical History:  Diagnosis Date  . Acne 06/15/2015  . Anxiety   . Anxiety and depression 06/15/2015  . Subchorionic hemorrhage in first trimester     Current Outpatient Medications  Medication Sig Dispense Refill  . cetirizine (ZYRTEC) 10 MG tablet Take 10 mg by mouth daily.    . Cholecalciferol (VITAMIN D3 PO) Take 1 tablet by mouth daily.    . ondansetron (ZOFRAN) 4 MG tablet Take 1 tablet (4 mg total) by mouth every 8 (eight) hours as needed. 20 tablet 2  . spironolactone (ALDACTONE) 25 MG tablet TAKE 1 TABLET(25 MG) BY MOUTH DAILY 90 tablet 0   No current facility-administered medications for this visit.    Allergies  Allergen Reactions  . Augmentin [Amoxicillin-Pot Clavulanate] Hives    Rash, itching  . Lexapro [Escitalopram] Rash    Family History  Problem Relation Age of Onset  . Other Neg Hx     Social History   Socioeconomic History  . Marital status: Married    Spouse name: Not on file  . Number of children: Not on file  . Years of education: Not on file  . Highest education level: Not on file  Occupational History  . Not on file  Tobacco Use  . Smoking status: Never Smoker  . Smokeless tobacco: Never Used  Substance and Sexual Activity  . Alcohol use: No    Alcohol/week: 0.0 standard drinks    Comment: occasional  . Drug use: No  . Sexual activity: Not Currently     Birth control/protection: None, Surgical    Comment: spouse had vasectomy  Other Topics Concern  . Not on file  Social History Narrative  . Not on file   Social Determinants of Health   Financial Resource Strain:   . Difficulty of Paying Living Expenses:   Food Insecurity:   . Worried About Programme researcher, broadcasting/film/video in the Last Year:   . Barista in the Last Year:   Transportation Needs:   . Freight forwarder (Medical):   Marland Kitchen Lack of Transportation (Non-Medical):   Physical Activity:   . Days of Exercise per Week:   . Minutes of Exercise per Session:   Stress:   . Feeling of Stress :   Social Connections:   . Frequency of Communication with Friends and Family:   . Frequency of Social Gatherings with Friends and Family:   . Attends Religious Services:   . Active Member of Clubs or Organizations:   . Attends Banker Meetings:   Marland Kitchen Marital Status:   Intimate Partner Violence:   . Fear of Current or Ex-Partner:   . Emotionally Abused:   Marland Kitchen Physically Abused:   . Sexually Abused:      Constitutional: Denies fever, malaise, fatigue, headache or  abrupt weight changes.  Respiratory: Denies difficulty breathing, shortness of breath, cough or sputum production.   Cardiovascular: Denies chest pain, chest tightness, palpitations or swelling in the hands or feet.  Gastrointestinal: Denies abdominal pain, bloating, constipation, diarrhea or blood in the stool.  GU: Denies urgency, frequency, pain with urination, burning sensation, blood in urine, odor or discharge. Musculoskeletal: Pt reports back pain. Denies decrease in range of motion, difficulty with gait, muscle pain or joint swelling.  Skin: Denies redness, rashes, lesions or ulcercations.  Neurological: Denies numbness, tingling, weakness or problems with balance and coordination.    No other specific complaints in a complete review of systems (except as listed in HPI above).  Objective:   Physical  Exam   Wt Readings from Last 3 Encounters:  12/20/19 120 lb (54.4 kg)  08/15/19 120 lb (54.4 kg)  04/22/17 148 lb (67.1 kg)    General: Appears her stated age, well developed, well nourished in NAD. Skin: Warm, dry and intact. No rashes noted. Cardiovascular: Normal rate and rhythm. S1,S2 noted.  No murmur, rubs or gallops noted.  Pulmonary/Chest: Normal effort and positive vesicular breath sounds. No respiratory distress. No wheezes, rales or ronchi noted.  Musculoskeletal: Normal flexion, extension and rotation of the spine. No bony tenderness noted over the spine. Strength 5/5 BUE/BLE. No difficulty with gait.  Neurological: Alert and oriented.Coordination normal.    BMET    Component Value Date/Time   NA 138 05/21/2019 1550   K 4.4 05/21/2019 1550   CL 104 05/21/2019 1550   CO2 29 05/21/2019 1550   GLUCOSE 104 (H) 05/21/2019 1550   BUN 19 05/21/2019 1550   CREATININE 0.68 05/21/2019 1550   CALCIUM 10.3 (H) 05/21/2019 1550   GFRNONAA >90 11/17/2012 1845   GFRAA >90 11/17/2012 1845    Lipid Panel  No results found for: CHOL, TRIG, HDL, CHOLHDL, VLDL, LDLCALC  CBC    Component Value Date/Time   WBC 12.8 (H) 04/24/2017 0508   RBC 3.28 (L) 04/24/2017 0508   HGB 9.6 (L) 04/24/2017 0508   HGB 12.2 01/17/2017 0832   HCT 28.4 (L) 04/24/2017 0508   HCT 37.4 01/17/2017 0832   PLT 170 04/24/2017 0508   PLT 215 01/17/2017 0832   MCV 86.6 04/24/2017 0508   MCV 91 01/17/2017 0832   MCH 29.3 04/24/2017 0508   MCHC 33.8 04/24/2017 0508   RDW 14.0 04/24/2017 0508   RDW 13.6 01/17/2017 0832   LYMPHSABS 2.2 04/23/2017 1500   MONOABS 0.7 04/23/2017 1500   EOSABS 0.1 04/23/2017 1500   BASOSABS 0.0 04/23/2017 1500    Hgb A1C No results found for: HGBA1C         Assessment & Plan:  Chronic Back Pain:  Referral place as requested Encouraged stretching Continue NSAID's OTC Will defer xrays to Chiropractor.  Return precautions discussed   Webb Silversmith, NP This  visit occurred during the SARS-CoV-2 public health emergency.  Safety protocols were in place, including screening questions prior to the visit, additional usage of staff PPE, and extensive cleaning of exam room while observing appropriate contact time as indicated for disinfecting solutions.

## 2020-03-29 ENCOUNTER — Encounter: Payer: Self-pay | Admitting: Internal Medicine

## 2020-03-29 NOTE — Patient Instructions (Signed)

## 2020-04-04 ENCOUNTER — Encounter: Payer: Self-pay | Admitting: Internal Medicine

## 2020-04-04 MED ORDER — SPIRONOLACTONE 25 MG PO TABS: ORAL_TABLET | ORAL | 0 refills | Status: DC

## 2020-05-05 ENCOUNTER — Telehealth: Payer: Self-pay

## 2020-05-05 DIAGNOSIS — M545 Low back pain, unspecified: Secondary | ICD-10-CM

## 2020-05-05 DIAGNOSIS — G8929 Other chronic pain: Secondary | ICD-10-CM

## 2020-05-05 NOTE — Addendum Note (Signed)
Addended by: Lorre Munroe on: 05/05/2020 01:46 PM   Modules accepted: Orders

## 2020-05-05 NOTE — Telephone Encounter (Signed)
Need new referral to [employer], Rogelia Boga (pt's former Land) for insurance.  Pt didn't complte [email or web address] [email or web address].  Had a "problem" at check in.

## 2020-05-05 NOTE — Telephone Encounter (Signed)
Referral placed.

## 2020-05-19 ENCOUNTER — Other Ambulatory Visit: Payer: Self-pay | Admitting: Internal Medicine

## 2020-06-13 ENCOUNTER — Encounter: Payer: Self-pay | Admitting: Internal Medicine

## 2020-06-13 ENCOUNTER — Other Ambulatory Visit: Payer: Self-pay | Admitting: Internal Medicine

## 2020-06-20 ENCOUNTER — Telehealth: Payer: No Typology Code available for payment source | Admitting: Internal Medicine

## 2020-08-22 ENCOUNTER — Other Ambulatory Visit: Payer: Self-pay | Admitting: Internal Medicine

## 2020-08-22 MED ORDER — ONDANSETRON HCL 4 MG PO TABS: 4.0000 mg | ORAL_TABLET | Freq: Three times a day (TID) | ORAL | 2 refills | Status: DC | PRN

## 2020-11-10 ENCOUNTER — Other Ambulatory Visit: Payer: Self-pay | Admitting: Internal Medicine

## 2020-11-10 MED ORDER — AZITHROMYCIN 250 MG PO TABS
ORAL_TABLET | ORAL | 0 refills | Status: DC
Start: 2020-11-10 — End: 2021-06-26

## 2021-01-23 ENCOUNTER — Emergency Department (HOSPITAL_COMMUNITY): Payer: No Typology Code available for payment source

## 2021-01-23 ENCOUNTER — Other Ambulatory Visit: Payer: Self-pay

## 2021-01-23 ENCOUNTER — Telehealth: Payer: Self-pay

## 2021-01-23 ENCOUNTER — Emergency Department (HOSPITAL_COMMUNITY)
Admission: EM | Admit: 2021-01-23 | Discharge: 2021-01-23 | Disposition: A | Discharge status: Home / Self Care | Payer: No Typology Code available for payment source | Attending: Emergency Medicine | Admitting: Emergency Medicine

## 2021-01-23 ENCOUNTER — Encounter (HOSPITAL_COMMUNITY): Payer: Self-pay | Admitting: Emergency Medicine

## 2021-01-23 DIAGNOSIS — R1031 Right lower quadrant pain: Secondary | ICD-10-CM | POA: Diagnosis present

## 2021-01-23 DIAGNOSIS — R112 Nausea with vomiting, unspecified: Secondary | ICD-10-CM | POA: Diagnosis not present

## 2021-01-23 DIAGNOSIS — N83291 Other ovarian cyst, right side: Secondary | ICD-10-CM | POA: Diagnosis not present

## 2021-01-23 DIAGNOSIS — N898 Other specified noninflammatory disorders of vagina: Secondary | ICD-10-CM | POA: Insufficient documentation

## 2021-01-23 DIAGNOSIS — F419 Anxiety disorder, unspecified: Secondary | ICD-10-CM | POA: Insufficient documentation

## 2021-01-23 DIAGNOSIS — N83201 Unspecified ovarian cyst, right side: Secondary | ICD-10-CM

## 2021-01-23 HISTORY — DX: Unspecified ovarian cyst, unspecified side: N83.209

## 2021-01-23 LAB — WET PREP, GENITAL
Clue Cells Wet Prep HPF POC: NONE SEEN
Sperm: NONE SEEN
Trich, Wet Prep: NONE SEEN
WBC, Wet Prep HPF POC: NONE SEEN
Yeast Wet Prep HPF POC: NONE SEEN

## 2021-01-23 LAB — URINALYSIS, ROUTINE W REFLEX MICROSCOPIC
Bacteria, UA: NONE SEEN
Bilirubin Urine: NEGATIVE
Glucose, UA: NEGATIVE mg/dL
Hgb urine dipstick: NEGATIVE
Ketones, ur: 80 mg/dL — AB
Leukocytes,Ua: NEGATIVE
Nitrite: NEGATIVE
Protein, ur: 100 mg/dL — AB
Specific Gravity, Urine: 1.031 — ABNORMAL HIGH (ref 1.005–1.030)
pH: 7 (ref 5.0–8.0)

## 2021-01-23 LAB — I-STAT BETA HCG BLOOD, ED (MC, WL, AP ONLY): I-stat hCG, quantitative: <5 m[IU]/mL (ref <5)

## 2021-01-23 LAB — COMPREHENSIVE METABOLIC PANEL
ALT: 17 U/L (ref 0–44)
AST: 20 U/L (ref 15–41)
Albumin: 4.1 g/dL (ref 3.5–5.0)
Alkaline Phosphatase: 55 U/L (ref 38–126)
Anion gap: 7 (ref 5–15)
BUN: 15 mg/dL (ref 6–20)
CO2: 22 mmol/L (ref 22–32)
Calcium: 11.1 mg/dL — ABNORMAL HIGH (ref 8.9–10.3)
Chloride: 110 mmol/L (ref 98–111)
Creatinine, Ser: 0.76 mg/dL (ref 0.44–1.00)
GFR, Estimated: >60 mL/min (ref >60)
Glucose, Bld: 103 mg/dL — ABNORMAL HIGH (ref 70–99)
Potassium: 4.1 mmol/L (ref 3.5–5.1)
Sodium: 139 mmol/L (ref 135–145)
Total Bilirubin: 1.6 mg/dL — ABNORMAL HIGH (ref 0.3–1.2)
Total Protein: 6.6 g/dL (ref 6.5–8.1)

## 2021-01-23 LAB — LIPASE, BLOOD: Lipase: 33 U/L (ref 11–51)

## 2021-01-23 LAB — CBC
HCT: 42.4 % (ref 36.0–46.0)
Hemoglobin: 14.6 g/dL (ref 12.0–15.0)
MCH: 29.4 pg (ref 26.0–34.0)
MCHC: 34.4 g/dL (ref 30.0–36.0)
MCV: 85.5 fL (ref 80.0–100.0)
Platelets: 278 10*3/uL (ref 150–400)
RBC: 4.96 MIL/uL (ref 3.87–5.11)
RDW: 12.4 % (ref 11.5–15.5)
WBC: 8.7 10*3/uL (ref 4.0–10.5)
nRBC: 0 % (ref 0.0–0.2)

## 2021-01-23 MED ORDER — FENTANYL CITRATE (PF) 100 MCG/2ML IJ SOLN
100.0000 ug | Freq: Once | INTRAMUSCULAR | Status: AC
  Administered 2021-01-23: 100 ug via INTRAVENOUS
  Filled 2021-01-23: qty 2

## 2021-01-23 MED ORDER — IOHEXOL 300 MG/ML  SOLN
100.0000 mL | Freq: Once | INTRAMUSCULAR | Status: AC | PRN
  Administered 2021-01-23: 100 mL via INTRAVENOUS

## 2021-01-23 MED ORDER — METOCLOPRAMIDE HCL 5 MG/ML IJ SOLN
10.0000 mg | Freq: Once | INTRAMUSCULAR | Status: AC
  Administered 2021-01-23: 10 mg via INTRAVENOUS
  Filled 2021-01-23: qty 2

## 2021-01-23 MED ORDER — SODIUM CHLORIDE 0.9 % IV BOLUS
1000.0000 mL | Freq: Once | INTRAVENOUS | Status: AC
  Administered 2021-01-23: 1000 mL via INTRAVENOUS

## 2021-01-23 MED ORDER — LORAZEPAM 2 MG/ML IJ SOLN
0.5000 mg | Freq: Once | INTRAMUSCULAR | Status: AC
  Administered 2021-01-23: 0.5 mg via INTRAVENOUS
  Filled 2021-01-23: qty 1

## 2021-01-23 MED ORDER — ONDANSETRON HCL 4 MG/2ML IJ SOLN
4.0000 mg | Freq: Once | INTRAMUSCULAR | Status: AC
  Administered 2021-01-23: 4 mg via INTRAVENOUS
  Filled 2021-01-23: qty 2

## 2021-01-23 NOTE — Telephone Encounter (Signed)
Pt's husband called for pt. Pt stating she thinks she has a cyst on left ovary. Advised pt to take NSAID'S for pain and Zofran for nausea, apply heating pad if needed or sit in warm bath. If symptoms worsen report to ER. Pt verbalizes understanding.
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
## 2021-01-23 NOTE — ED Notes (Signed)
Patient transported to Ultrasound 

## 2021-01-23 NOTE — Discharge Instructions (Signed)
You were evaluated in the Emergency Department and after careful evaluation, we did not find any emergent condition requiring admission or further testing in the hospital.  Your CT scan today comment on a cyst in your pelvis which is likely ovarian.  Please take Tylenol or ibuprofen for pain.  Please follow-up with your OB/GYN about this and possibly managing anxiety.  Please return to the Emergency Department if you experience any worsening of your condition.   Thank you for allowing [place] to be a part of your care.

## 2021-01-23 NOTE — ED Triage Notes (Signed)
C/o RLQ pain and nausea that started today.  History of ovarian cyst.  Heavy vaginal discharge x 3 days.

## 2021-01-23 NOTE — ED Provider Notes (Signed)
[facility] EMERGENCY DEPARTMENT Provider Note   CSN: [id number] Arrival date & time: 01/23/21  1411     History Chief Complaint  Patient presents with  . Abdominal Pain    Anna [name] is a 35 y.o. female is a past history of ovarian cysts and anxiety.  She presents with a chief complaint of right groin pain.  Patient reports that the pain began suddenly early this morning while sleeping.  Pain started suddenly and has progressively worsened.  Pain has been constant.  Pain radiates to her right flank.  Patient rates her pain 7/10 on the pain scale.  Patient reports pain is worse with palpation or movement.  Patient denies any alleviating factors.  Patient reports this pain is similar to pain she has experienced when she has had previous ovarian cyst however this pain is worse than what she has previously experienced. She endorses associated nausea, vomiting and shortness of breath which she attributes to her pain and anxiety.  Patient reports minimal emesis and vomiting, describes emesis as clear liquid, no blood or coffee-ground emesis.  Patient reports that she noted some on toilet paper after wiping with a bowel movement today.  Patient denies any pain during defecation.  Patient denies any blood in toilet bowl.  Patient denies any melena.  Patient reports that over the last 3 days she has had clear discharge and some discomfort and her right groin.  Patient reports the symptoms are consistent with previous ovarian cysts she has had in the past.  Patient denies any fevers, chills, abdominal distention, dysuria, hematuria, urinary frequency, decreased urine output, vaginal pain, vaginal bleeding, lightheadedness, dizziness, syncopal episodes.  LMP 2/27-3/5.  Patient is sexually active with female partner in mutually monogamous relationship.  Patient reports husband has had vasectomy no concern for pregnancy.  Patient gravida 2P2002 with both vaginal deliveries.  She denies any  abdominal surgeries.   HPI     Past Medical History:  Diagnosis Date  . Acne 06/15/2015  . Anxiety   . Anxiety and depression 06/15/2015  . Ovarian cyst   . Subchorionic hemorrhage in first trimester     Patient Active Problem List   Diagnosis Date Noted  . Chronic right shoulder pain 04/21/2018    History reviewed. No pertinent surgical history.   OB History    Gravida  2   Para  2   Term  2   Preterm  0   AB  0   Living  2     SAB  0   IAB  0   Ectopic  0   Multiple  0   Live Births  2           Family History  Problem Relation Age of Onset  . Other Neg Hx     Social History   Tobacco Use  . Smoking status: Never Smoker  . Smokeless tobacco: Never Used  Substance Use Topics  . Alcohol use: No    Alcohol/week: 0.0 standard drinks    Comment: occasional  . Drug use: No    Home Medications Prior to Admission medications   Medication Sig Start Date End Date Taking? Authorizing Provider  azithromycin (ZITHROMAX) 250 MG tablet Take 2 tabs today, then 1 tab daily x 4 days 11/10/20   Lorre Munroe, NP  cetirizine (ZYRTEC) 10 MG tablet Take 10 mg by mouth daily.    [provider]  Cholecalciferol (VITAMIN D3 PO) Take 1 tablet by mouth  daily.    [provider]  Homeopathic Products [facility] ALLERGY RELIEF NA) Place into the nose.    [provider]  ipratropium (ATROVENT) 0.06 % nasal spray SMARTSIG:1-2 Spray(s) Both Nares 3 Times Daily 01/18/20   [provider]  ondansetron (ZOFRAN) 4 MG tablet Take 1 tablet (4 mg total) by mouth every 8 (eight) hours as needed. 08/22/20   Lorre Munroe, NP  spironolactone (ALDACTONE) 25 MG tablet Take 1 tablet (25 mg total) by mouth daily. MUST SCHEDULE PHYSICAL 05/22/20   Lorre Munroe, NP    Allergies    Augmentin [amoxicillin-pot clavulanate] and Lexapro [escitalopram]  Review of Systems   Review of Systems  Constitutional: Negative for chills and fever.  Eyes:  Negative for visual disturbance.  Respiratory: Positive for shortness of breath.   Cardiovascular: Negative for chest pain.  Gastrointestinal: Positive for blood in stool, nausea and vomiting. Negative for abdominal distention, abdominal pain, anal bleeding, constipation, diarrhea and rectal pain.  Genitourinary: Positive for flank pain and vaginal discharge. Negative for decreased urine volume, difficulty urinating, dysuria, frequency, genital sores, hematuria, menstrual problem, pelvic pain, vaginal bleeding and vaginal pain.  Musculoskeletal: Negative for back pain and neck pain.  Skin: Negative for color change and rash.  Neurological: Negative for dizziness, syncope, light-headedness and headaches.  Psychiatric/Behavioral: Negative for confusion. The patient is nervous/anxious.     Physical Exam Updated Vital Signs BP 113/72   Pulse (!) 114   Temp 97.7 F (36.5 C)   Resp (!) 22   LMP 01/20/2021   SpO2 100%   Physical Exam Vitals and nursing note reviewed.  Constitutional:      General: She is not in acute distress.    Appearance: She is not toxic-appearing or diaphoretic.     Comments: Patient appears uncomfortable due to her complaints of right groin pain  HENT:     Head: Normocephalic.  Eyes:     General: No scleral icterus.       Right eye: No discharge.        Left eye: No discharge.  Cardiovascular:     Rate and Rhythm: Tachycardia present.     Heart sounds: Normal heart sounds.  Pulmonary:     Effort: Pulmonary effort is normal. Tachypnea present. No bradypnea or respiratory distress.     Breath sounds: Normal breath sounds. No stridor. No decreased breath sounds, wheezing, rhonchi or rales.  Abdominal:     General: Abdomen is flat. Bowel sounds are normal. There is no distension. There are no signs of injury.     Palpations: Abdomen is soft. There is no mass or pulsatile mass.     Tenderness: There is abdominal tenderness. There is no right CVA tenderness, left  CVA tenderness, guarding or rebound. Negative signs include McBurney's sign.     Hernia: There is no hernia in the umbilical area or ventral area.     Comments: Tenderness to right lower groin  Musculoskeletal:     Cervical back: Neck supple.     Right lower leg: No swelling, deformity, lacerations, tenderness or bony tenderness. No edema.     Left lower leg: No deformity, lacerations, tenderness or bony tenderness. No edema.  Skin:    General: Skin is warm and dry.  Neurological:     General: No focal deficit present.     Mental Status: She is alert.     GCS: GCS eye subscore is 4. GCS verbal subscore is 5. GCS motor subscore is  6.  Psychiatric:        Mood and Affect: Mood is anxious.        Behavior: Behavior is cooperative.     ED Results / Procedures / Treatments   Labs (all labs ordered are listed, but only abnormal results are displayed) Labs Reviewed  CBC  LIPASE, BLOOD  COMPREHENSIVE METABOLIC PANEL  URINALYSIS, ROUTINE W REFLEX MICROSCOPIC  I-STAT BETA HCG BLOOD, ED ([facility], [facility], [facility] ONLY)    EKG None  Radiology No results found.  Procedures Procedures   Medications Ordered in ED Medications  fentaNYL (SUBLIMAZE) injection 100 mcg (has no administration in time range)  ondansetron (ZOFRAN) injection 4 mg (has no administration in time range)    ED Course  I have reviewed the triage vital signs and the nursing notes.  Pertinent labs & imaging results that were available during my care of the patient were reviewed by me and considered in my medical decision making (see chart for details).  Clinical Course as of 01/23/21 1912  Tue Jan 23, 2021  1911 MCV: 85.5 [PB]    Clinical Course User Index [PB] Berneice Heinrich   MDM Rules/Calculators/A&P                          35 year old female in no acute distress, nontoxic-appearing.  Patient is uncomfortable appearing and anxious.  Patient complains of pain to her right lower groin.  She has history of  ovarian cysts.  Reports that the previous 3 days she felt like she was developing an ovarian cyst due to vaginal discharge and mild discomfort in her right lower groin.  Last night patient had sudden onset of severe right lower groin pain worsening throughout the day.  On physical exam normoactive bowel sounds, abdomen is soft, nondistended, tenderness to right lower groin.    Due to patient's history of variant cysts and sudden onset of right lower groin pain concern for ovarian torsion.  Ultrasound ordered to evaluate for ovarian torsion.  I-STAT beta hCG less than 5, low suspicion for ectopic pregnancy or intrauterine pregnancy.  CMP, lipase, CBC with differential, urinalysis pending.  Was ordered fentanyl and Zofran.    Patient will need pelvic exam performed.    If ultrasound is unremarkable would consider CT abdomen pelvis to rule out possible appendicitis.    Patient care transferred to PA Belaya at the end of my shift. Patient presentation, ED course, and plan of care discussed with review of all pertinent labs and imaging. Please see his/her note for further details regarding further ED course and disposition.   Final Clinical Impression(s) / ED Diagnoses Final diagnoses:  None    Rx / DC Orders ED Discharge Orders    None       Haskel Schroeder, PA-C 01/23/21 1607    Terald Sleeper, MD 01/24/21 [phone]

## 2021-01-24 LAB — GC/CHLAMYDIA PROBE AMP (~~LOC~~) NOT AT ARMC
Chlamydia: NEGATIVE
Comment: NEGATIVE
Comment: NORMAL
Neisseria Gonorrhea: NEGATIVE

## 2021-03-21 ENCOUNTER — Other Ambulatory Visit: Payer: Self-pay | Admitting: Internal Medicine

## 2021-03-21 MED ORDER — OSELTAMIVIR PHOSPHATE 75 MG PO CAPS: 75.0000 mg | ORAL_CAPSULE | Freq: Two times a day (BID) | ORAL | 0 refills | Status: DC

## 2021-03-26 ENCOUNTER — Other Ambulatory Visit: Payer: Self-pay | Admitting: Internal Medicine

## 2021-03-26 MED ORDER — DOXYCYCLINE HYCLATE 100 MG PO TABS: 100.0000 mg | ORAL_TABLET | Freq: Two times a day (BID) | ORAL | 0 refills | Status: DC

## 2021-03-26 MED ORDER — PREDNISONE 10 MG PO TABS: ORAL_TABLET | ORAL | 0 refills | Status: DC

## 2021-06-26 ENCOUNTER — Other Ambulatory Visit: Payer: Self-pay | Admitting: Internal Medicine

## 2021-06-26 MED ORDER — NITROFURANTOIN MONOHYD MACRO 100 MG PO CAPS: 100.0000 mg | ORAL_CAPSULE | Freq: Two times a day (BID) | ORAL | 0 refills | Status: DC

## 2021-09-14 ENCOUNTER — Other Ambulatory Visit: Payer: Self-pay | Admitting: Internal Medicine

## 2021-09-14 MED ORDER — AZITHROMYCIN 250 MG PO TABS: ORAL_TABLET | ORAL | 0 refills | Status: DC

## 2021-10-02 ENCOUNTER — Other Ambulatory Visit: Payer: Self-pay | Admitting: Internal Medicine

## 2021-10-02 MED ORDER — DOXYCYCLINE HYCLATE 100 MG PO TABS: 100.0000 mg | ORAL_TABLET | Freq: Two times a day (BID) | ORAL | 0 refills | Status: DC

## 2022-02-11 ENCOUNTER — Other Ambulatory Visit: Payer: Self-pay | Admitting: Internal Medicine

## 2022-02-11 MED ORDER — PREDNISONE 10 MG PO TABS: ORAL_TABLET | ORAL | 0 refills | Status: DC

## 2022-02-26 ENCOUNTER — Emergency Department (HOSPITAL_BASED_OUTPATIENT_CLINIC_OR_DEPARTMENT_OTHER): Payer: Self-pay

## 2022-02-26 ENCOUNTER — Emergency Department (HOSPITAL_BASED_OUTPATIENT_CLINIC_OR_DEPARTMENT_OTHER)
Admission: EM | Admit: 2022-02-26 | Discharge: 2022-02-26 | Disposition: A | Discharge status: Home / Self Care | Payer: Self-pay | Attending: Emergency Medicine | Admitting: Emergency Medicine

## 2022-02-26 ENCOUNTER — Encounter (HOSPITAL_BASED_OUTPATIENT_CLINIC_OR_DEPARTMENT_OTHER): Payer: Self-pay

## 2022-02-26 ENCOUNTER — Other Ambulatory Visit: Payer: Self-pay

## 2022-02-26 DIAGNOSIS — R1013 Epigastric pain: Secondary | ICD-10-CM | POA: Insufficient documentation

## 2022-02-26 DIAGNOSIS — R112 Nausea with vomiting, unspecified: Secondary | ICD-10-CM | POA: Insufficient documentation

## 2022-02-26 DIAGNOSIS — D72829 Elevated white blood cell count, unspecified: Secondary | ICD-10-CM | POA: Insufficient documentation

## 2022-02-26 DIAGNOSIS — R197 Diarrhea, unspecified: Secondary | ICD-10-CM | POA: Insufficient documentation

## 2022-02-26 DIAGNOSIS — E860 Dehydration: Secondary | ICD-10-CM | POA: Insufficient documentation

## 2022-02-26 DIAGNOSIS — R718 Other abnormality of red blood cells: Secondary | ICD-10-CM | POA: Insufficient documentation

## 2022-02-26 LAB — URINALYSIS, ROUTINE W REFLEX MICROSCOPIC
Bilirubin Urine: NEGATIVE
Glucose, UA: NEGATIVE mg/dL
Hgb urine dipstick: NEGATIVE
Ketones, ur: >80 mg/dL — AB
Leukocytes,Ua: NEGATIVE
Nitrite: NEGATIVE
Specific Gravity, Urine: 1.026 (ref 1.005–1.030)
pH: 7 (ref 5.0–8.0)

## 2022-02-26 LAB — CBC
HCT: 44.6 % (ref 36.0–46.0)
Hemoglobin: 14.7 g/dL (ref 12.0–15.0)
MCH: 28 pg (ref 26.0–34.0)
MCHC: 33 g/dL (ref 30.0–36.0)
MCV: 85 fL (ref 80.0–100.0)
Platelets: 236 10*3/uL (ref 150–400)
RBC: 5.25 MIL/uL — ABNORMAL HIGH (ref 3.87–5.11)
RDW: 12.4 % (ref 11.5–15.5)
WBC: 12.2 10*3/uL — ABNORMAL HIGH (ref 4.0–10.5)
nRBC: 0 % (ref 0.0–0.2)

## 2022-02-26 LAB — COMPREHENSIVE METABOLIC PANEL
ALT: 14 U/L (ref 0–44)
AST: 16 U/L (ref 15–41)
Albumin: 4.6 g/dL (ref 3.5–5.0)
Alkaline Phosphatase: 55 U/L (ref 38–126)
Anion gap: 11 (ref 5–15)
BUN: 16 mg/dL (ref 6–20)
CO2: 25 mmol/L (ref 22–32)
Calcium: 10.5 mg/dL — ABNORMAL HIGH (ref 8.9–10.3)
Chloride: 103 mmol/L (ref 98–111)
Creatinine, Ser: 0.72 mg/dL (ref 0.44–1.00)
GFR, Estimated: >60 mL/min (ref >60)
Glucose, Bld: 113 mg/dL — ABNORMAL HIGH (ref 70–99)
Potassium: 3.5 mmol/L (ref 3.5–5.1)
Sodium: 139 mmol/L (ref 135–145)
Total Bilirubin: 1.4 mg/dL — ABNORMAL HIGH (ref 0.3–1.2)
Total Protein: 7.1 g/dL (ref 6.5–8.1)

## 2022-02-26 LAB — PREGNANCY, URINE: Preg Test, Ur: NEGATIVE

## 2022-02-26 LAB — LIPASE, BLOOD: Lipase: 18 U/L (ref 11–51)

## 2022-02-26 MED ORDER — ONDANSETRON HCL 4 MG/2ML IJ SOLN: 4.0000 mg | Freq: Once | INTRAMUSCULAR | Status: DC | PRN

## 2022-02-26 MED ORDER — LORAZEPAM 1 MG PO TABS: 0.5000 mg | ORAL_TABLET | Freq: Once | ORAL | Status: DC

## 2022-02-26 MED ORDER — LORAZEPAM 2 MG/ML IJ SOLN
0.5000 mg | Freq: Once | INTRAMUSCULAR | Status: AC
  Administered 2022-02-26: 0.5 mg via INTRAVENOUS
  Filled 2022-02-26: qty 1

## 2022-02-26 MED ORDER — ALUM & MAG HYDROXIDE-SIMETH 200-200-20 MG/5ML PO SUSP
30.0000 mL | Freq: Once | ORAL | Status: AC
Start: 2022-02-26 — End: 2022-02-26
  Administered 2022-02-26: 30 mL via ORAL
  Filled 2022-02-26: qty 30

## 2022-02-26 MED ORDER — PROMETHAZINE HCL 25 MG PO TABS: 12.5000 mg | ORAL_TABLET | Freq: Four times a day (QID) | ORAL | 0 refills | Status: DC | PRN

## 2022-02-26 MED ORDER — METOCLOPRAMIDE HCL 5 MG/ML IJ SOLN
10.0000 mg | Freq: Once | INTRAMUSCULAR | Status: AC
  Administered 2022-02-26: 10 mg via INTRAVENOUS
  Filled 2022-02-26: qty 2

## 2022-02-26 MED ORDER — LIDOCAINE VISCOUS HCL 2 % MT SOLN
15.0000 mL | Freq: Once | OROMUCOSAL | Status: AC
Start: 2022-02-26 — End: 2022-02-26
  Administered 2022-02-26: 15 mL via ORAL
  Filled 2022-02-26: qty 15

## 2022-02-26 MED ORDER — ONDANSETRON HCL 4 MG/2ML IJ SOLN
4.0000 mg | Freq: Once | INTRAMUSCULAR | Status: AC
  Administered 2022-02-26: 4 mg via INTRAVENOUS
  Filled 2022-02-26: qty 2

## 2022-02-26 MED ORDER — SODIUM CHLORIDE 0.9 % IV BOLUS
1000.0000 mL | Freq: Once | INTRAVENOUS | Status: AC
  Administered 2022-02-26: 1000 mL via INTRAVENOUS

## 2022-02-26 MED ORDER — IOHEXOL 300 MG/ML  SOLN
100.0000 mL | Freq: Once | INTRAMUSCULAR | Status: AC | PRN
  Administered 2022-02-26: 75 mL via INTRAVENOUS

## 2022-02-26 MED ORDER — MORPHINE SULFATE (PF) 4 MG/ML IV SOLN
4.0000 mg | Freq: Once | INTRAVENOUS | Status: AC
  Administered 2022-02-26: 4 mg via INTRAVENOUS
  Filled 2022-02-26: qty 1

## 2022-02-26 MED ORDER — DICYCLOMINE HCL 10 MG PO CAPS
10.0000 mg | ORAL_CAPSULE | Freq: Once | ORAL | Status: AC
  Administered 2022-02-26: 10 mg via ORAL
  Filled 2022-02-26: qty 1

## 2022-02-26 NOTE — ED Notes (Signed)
Patient transported to CT 

## 2022-02-26 NOTE — ED Triage Notes (Signed)
Patient here POV from Home with ABD Pain. ? ?Endorses Pain that began this AM when she awoke. Pain is LLQ and radiates to Mid ABD and Lower Back. Sharp in Rea. ? ?No Known Fevers. Moderate Nausea. Moderate Emesis. Moderate Diarrhea. ? ?Actively Nauseous in Triage. A&Ox4. GCS 15. BIB Wheelchair. ?

## 2022-02-26 NOTE — ED Notes (Signed)
Pt verbalizes understanding of discharge instructions. Opportunity for questioning and answers were provided. Pt discharged from ED to home.   ? ?

## 2022-02-26 NOTE — ED Provider Notes (Signed)
?[facility] ?Provider Note ? ? ?CSN: [id number] ?Arrival date & time: 02/26/22  1525 ? ?  ? ?History ?PMH: ovarian cysts ?Chief Complaint  ?Patient presents with  ? Abdominal Pain  ? ? ?Anna [name] is a 36 y.o. female. ?Presents the emergency department with a chief complaint of nausea, vomiting, and diarrhea.  She says last night, she ate her dinner and says she felt a little bit off where she had some burning in her epigastric region.  She has this happen intermittently that she did not think much of it.  She says she felt fine after this.  Around 12 PM today, she started having persistent nausea and vomiting with associated diarrhea.  She says she has vomited multiple times and is nonbloody and nonbilious.  She has had 6-7 episodes of diarrhea that is nonbloody.  She says she has had associated abdominal pain in the epigastric region that radiates down the left lower quadrant and also wraps around bilateral lower back.  She says it feels like cramping.  States is constant.  She has never had anything like this before.  Nobody else in the family is sick.  No recent travel.  She has not had any fevers.  Denies chest pain, shortness of breath dysuria, hematuria. ? ?Abdominal Pain ?Associated symptoms: diarrhea, nausea and vomiting   ? ?  ? ?Home Medications ?Prior to Admission medications   ?Medication Sig Start Date End Date Taking? Authorizing Provider  ?ondansetron (ZOFRAN) 4 MG tablet Take 1 tablet (4 mg total) by mouth every 8 (eight) hours as needed. 08/22/20  Yes Jearld Fenton, NP  ?promethazine (PHENERGAN) 25 MG tablet Take 0.5 tablets (12.5 mg total) by mouth every 6 (six) hours as needed for up to 2 days for refractory nausea / vomiting. 02/26/22 02/28/22 Yes Teddy Rebstock, Adora Fridge, PA-C  ?Turmeric (QC TUMERIC COMPLEX PO) Take by mouth.   Yes [provider]  ?doxycycline (VIBRA-TABS) 100 MG tablet Take 1 tablet (100 mg total) by mouth 2 (two) times daily. ?Patient not  taking: Reported on 02/26/2022 10/02/21   Jearld Fenton, NP  ?oseltamivir (TAMIFLU) 75 MG capsule Take 1 capsule (75 mg total) by mouth 2 (two) times daily. ?Patient not taking: Reported on 02/26/2022 03/21/21   Jearld Fenton, NP  ?predniSONE (DELTASONE) 10 MG tablet Take 3 tabs on days 1-2, take 2 tabs on days 3-4, take 1 tab on days 5-6 ?Patient not taking: Reported on 02/26/2022 02/11/22   Jearld Fenton, NP  ?spironolactone (ALDACTONE) 25 MG tablet Take 1 tablet (25 mg total) by mouth daily. MUST SCHEDULE PHYSICAL ?Patient not taking: Reported on 02/26/2022 05/22/20   Jearld Fenton, NP  ?   ? ?Allergies    ?Augmentin [amoxicillin-pot clavulanate] and Lexapro [escitalopram]   ? ?Review of Systems   ?Review of Systems  ?Gastrointestinal:  Positive for abdominal pain, diarrhea, nausea and vomiting.  ?All other systems reviewed and are negative. ? ?Physical Exam ?Updated Vital Signs ?BP 111/63   Pulse 83   Temp (!) 97.5 ?F (36.4 ?C) (Temporal)   Resp 18   Ht 5\' 4"  (1.626 m)   Wt 57.6 kg   SpO2 100%   BMI 21.80 kg/m?  ?Physical Exam ?Vitals and nursing note reviewed.  ?Constitutional:   ?   General: She is not in acute distress. ?   Appearance: Normal appearance. She is well-developed. She is not ill-appearing, toxic-appearing or diaphoretic.  ?HENT:  ?   Head: Normocephalic and  atraumatic.  ?   Nose: No nasal deformity.  ?   Mouth/Throat:  ?   Lips: Pink. No lesions.  ?Eyes:  ?   General: Gaze aligned appropriately. No scleral icterus.    ?   Right eye: No discharge.     ?   Left eye: No discharge.  ?   Conjunctiva/sclera: Conjunctivae normal.  ?   Right eye: Right conjunctiva is not injected. No exudate or hemorrhage. ?   Left eye: Left conjunctiva is not injected. No exudate or hemorrhage. ?Pulmonary:  ?   Effort: Pulmonary effort is normal. No respiratory distress.  ?Abdominal:  ?   General: Abdomen is flat. There is no distension.  ?   Tenderness: There is abdominal tenderness in the epigastric area and  periumbilical area. There is no right CVA tenderness, left CVA tenderness, guarding or rebound. Negative signs include Murphy's sign, Rovsing's sign and McBurney's sign.  ?   Hernia: No hernia is present.  ?   Comments: Exam is difficult to assess due to patient being persistently tearful and crying out in pain.  She has subjective pain in the epigastric and periumbilical region.  No guarding or rigidity in any of these areas on exam.  ?Skin: ?   General: Skin is warm and dry.  ?Neurological:  ?   Mental Status: She is alert and oriented to person, place, and time.  ?Psychiatric:     ?   Mood and Affect: Mood normal.     ?   Speech: Speech normal.     ?   Behavior: Behavior normal. Behavior is cooperative.  ? ? ?ED Results / Procedures / Treatments   ?Labs ?(all labs ordered are listed, but only abnormal results are displayed) ?Labs Reviewed  ?COMPREHENSIVE METABOLIC PANEL - Abnormal; Notable for the following components:  ?    Result Value  ? Glucose, Bld 113 (*)   ? Calcium 10.5 (*)   ? Total Bilirubin 1.4 (*)   ? All other components within normal limits  ?CBC - Abnormal; Notable for the following components:  ? WBC 12.2 (*)   ? RBC 5.25 (*)   ? All other components within normal limits  ?URINALYSIS, ROUTINE W REFLEX MICROSCOPIC - Abnormal; Notable for the following components:  ? Ketones, ur >80 (*)   ? Protein, ur TRACE (*)   ? All other components within normal limits  ?LIPASE, BLOOD  ?PREGNANCY, URINE  ? ? ?EKG ?None ? ?Radiology ?CT Abdomen Pelvis W Contrast ? ?Result Date: 02/26/2022 ?CLINICAL DATA:  Lower quadrant abdominal pain EXAM: CT ABDOMEN AND PELVIS WITH CONTRAST TECHNIQUE: Multidetector CT imaging of the abdomen and pelvis was performed using the standard protocol following bolus administration of intravenous contrast. RADIATION DOSE REDUCTION: This exam was performed according to the departmental dose-optimization program which includes automated exposure control, adjustment of the mA and/or kV  according to patient size and/or use of iterative reconstruction technique. CONTRAST:  22mL OMNIPAQUE IOHEXOL 300 MG/ML  SOLN COMPARISON:  CT abdomen and pelvis dated January 23, 2021 FINDINGS: Lower chest: No acute abnormality. Hepatobiliary: No focal liver abnormality is seen. No gallstones, gallbladder wall thickening, or biliary dilatation. Pancreas: Unremarkable. No pancreatic ductal dilatation or surrounding inflammatory changes. Spleen: Normal in size without focal abnormality. Adrenals/Urinary Tract: Bilateral adrenal glands are unremarkable. Kidneys enhance symmetrically with no evidence of hydronephrosis or nephrolithiasis. Bladder is decompressed. Stomach/Bowel: Stomach is within normal limits. Appendix appears normal. No evidence of bowel wall thickening, distention, or inflammatory changes.  Vascular/Lymphatic: No significant vascular findings are present. No enlarged abdominal or pelvic lymph nodes. Reproductive: Simple appearing right adnexal cyst measuring 2.5 cm on series 2, image 60, likely physiologic with no further follow-up needed. Dilated myometrial and left greater than right adnexal veins. Other: No abdominal wall hernia or abnormality. No abdominopelvic ascites. Musculoskeletal: No acute or significant osseous findings. IMPRESSION: 1. No acute findings in the abdomen or pelvis. 2. Dilated myometrial and left greater than right adnexal veins, findings are similar to prior exam and can be seen in the setting pelvic congestion syndrome. Electronically Signed   By: Yetta Glassman M.D.   On: 02/26/2022 18:59   ? ?Procedures ?Procedures  ? ? ?Medications Ordered in ED ?Medications  ?ondansetron (ZOFRAN) injection 4 mg (has no administration in time range)  ?sodium chloride 0.9 % bolus 1,000 mL (0 mLs Intravenous Stopped 02/26/22 1809)  ?ondansetron (ZOFRAN) injection 4 mg (4 mg Intravenous Given 02/26/22 1606)  ?morphine (PF) 4 MG/ML injection 4 mg (4 mg Intravenous Given 02/26/22 1606)  ?LORazepam  (ATIVAN) injection 0.5 mg (0.5 mg Intravenous Given 02/26/22 1606)  ?alum & mag hydroxide-simeth (MAALOX/MYLANTA) 200-200-20 MG/5ML suspension 30 mL (30 mLs Oral Given 02/26/22 1814)  ?  And  ?lidocaine (XYLOCAINE) 2 %

## 2022-02-26 NOTE — Discharge Instructions (Addendum)
Your work up today was reassuring with no abnormal findings. That being said, this is likely a viral or food related process. I have prescribed you phenergan that you can take if your home Zofran is not controlling your symptoms. You can take imodium at home for diarrhea symptoms. Recommend hydration as tolerated. Please return if symptoms are continuing to worsen or not improving after 1-2 days.  ?

## 2022-06-27 ENCOUNTER — Other Ambulatory Visit: Payer: Self-pay | Admitting: Internal Medicine

## 2022-06-27 MED ORDER — DOXYCYCLINE HYCLATE 100 MG PO TABS: 100.0000 mg | ORAL_TABLET | Freq: Two times a day (BID) | ORAL | 0 refills | Status: DC

## 2022-08-28 ENCOUNTER — Ambulatory Visit: Payer: Self-pay | Admitting: Internal Medicine

## 2022-08-28 ENCOUNTER — Encounter: Payer: Self-pay | Admitting: Internal Medicine

## 2022-08-28 VITALS — BP 124/82 | HR 67 | Temp 97.1°F | Ht 64.5 in | Wt 123.0 lb

## 2022-08-28 DIAGNOSIS — R431 Parosmia: Secondary | ICD-10-CM

## 2022-08-28 DIAGNOSIS — R438 Other disturbances of smell and taste: Secondary | ICD-10-CM

## 2022-08-28 DIAGNOSIS — R5383 Other fatigue: Secondary | ICD-10-CM

## 2022-08-28 DIAGNOSIS — Z0001 Encounter for general adult medical examination with abnormal findings: Secondary | ICD-10-CM

## 2022-08-28 DIAGNOSIS — Z1159 Encounter for screening for other viral diseases: Secondary | ICD-10-CM

## 2022-08-28 DIAGNOSIS — R531 Weakness: Secondary | ICD-10-CM

## 2022-08-28 NOTE — Patient Instructions (Signed)

## 2022-08-28 NOTE — Progress Notes (Signed)
Subjective:    Patient ID: Anna [name], female    DOB: 1986-11-06, 36 y.o.   MRN: [id number]  HPI  Patient presents to clinic today for her annual exam.  Flu: 08/2019 Tetanus: 04/2017 COVID: Never Pap smear: 05/2017 Dentist: biannually  Diet: She does eat meat. She consumes fruits and veggies. She tries to avoid fried foods. She drinks mostly water. Exercise: Hiking  Review of Systems     Past Medical History:  Diagnosis Date   Acne 06/15/2015   Anxiety    Anxiety and depression 06/15/2015   Ovarian cyst    Subchorionic hemorrhage in first trimester     Current Outpatient Medications  Medication Sig Dispense Refill   ondansetron (ZOFRAN) 4 MG tablet Take 1 tablet (4 mg total) by mouth every 8 (eight) hours as needed. 20 tablet 2   Turmeric (QC TUMERIC COMPLEX PO) Take by mouth.     No current facility-administered medications for this visit.    Allergies  Allergen Reactions   Augmentin [Amoxicillin-Pot Clavulanate] Hives    Rash, itching   Lexapro [Escitalopram] Rash    Family History  Problem Relation Age of Onset   Other Neg Hx     Social History   Socioeconomic History   Marital status: Married    Spouse name: Not on file   Number of children: Not on file   Years of education: Not on file   Highest education level: Not on file  Occupational History   Not on file  Tobacco Use   Smoking status: Never   Smokeless tobacco: Never  Substance and Sexual Activity   Alcohol use: No    Alcohol/week: 0.0 standard drinks of alcohol    Comment: occasional   Drug use: No   Sexual activity: Not Currently    Birth control/protection: None, Surgical    Comment: spouse had vasectomy  Other Topics Concern   Not on file  Social History Narrative   Not on file   Social Determinants of Health   Financial Resource Strain: Not on file  Food Insecurity: Not on file  Transportation Needs: Not on file  Physical Activity: Not on file  Stress: Not on file  Social  Connections: Not on file  Intimate Partner Violence: Not on file     Constitutional: Pt reports fatigue. Denies fever, malaise, headache or abrupt weight changes.  HEENT: Pt reports metallic taste and smell. Denies eye pain, eye redness, ear pain, ringing in the ears, wax buildup, runny nose, nasal congestion, bloody nose, or sore throat. Respiratory: Denies difficulty breathing, shortness of breath, cough or sputum production.   Cardiovascular: Denies chest pain, chest tightness, palpitations or swelling in the hands or feet.  Gastrointestinal: Denies abdominal pain, bloating, constipation, diarrhea or blood in the stool.  GU: Denies urgency, frequency, pain with urination, burning sensation, blood in urine, odor or discharge. Musculoskeletal: Pt reports weakness. Denies decrease in range of motion, difficulty with gait, muscle pain or joint pain and swelling.  Skin: Denies redness, rashes, lesions or ulcercations.  Neurological: Denies dizziness, difficulty with memory, difficulty with speech or problems with balance and coordination.  Psych: Denies anxiety, depression, SI/HI.  No other specific complaints in a complete review of systems (except as listed in HPI above).  Objective:   Physical Exam BP 124/82 (BP Location: Left Arm, Patient Position: Sitting, Cuff Size: Normal)   Pulse 67   Temp (!) 97.1 F (36.2 C) (Temporal)   Ht 5' 4.5" (1.638 m)   Wt  123 lb (55.8 kg)   SpO2 97%   BMI 20.79 kg/m   Wt Readings from Last 3 Encounters:  02/26/22 126 lb 15.8 oz (57.6 kg)  01/23/21 126 lb 15.8 oz (57.6 kg)  03/28/20 127 lb (57.6 kg)    General: Appears her stated age, well developed, well nourished in NAD. Skin: Warm, dry and intact. HEENT: Head: normal shape and size; Eyes: sclera white, no icterus, conjunctiva pink, PERRLA and EOMs intact;  Neck:  Neck supple, trachea midline. No masses, lumps or thyromegaly present.  Cardiovascular: Normal rate and rhythm. S1,S2 noted.  No  murmur, rubs or gallops noted. No JVD or BLE edema.  Pulmonary/Chest: Normal effort and positive vesicular breath sounds. No respiratory distress. No wheezes, rales or ronchi noted.  Abdomen: Normal bowel sounds Musculoskeletal:Strength 5/5 BUE/BLE. No difficulty with gait.  Neurological: Alert and oriented. Cranial nerves II-XII grossly intact. Coordination normal.  Psychiatric: Mood and affect normal. Behavior is normal. Judgment and thought content normal.    BMET    Component Value Date/Time   NA 139 02/26/2022 1550   K 3.5 02/26/2022 1550   CL 103 02/26/2022 1550   CO2 25 02/26/2022 1550   GLUCOSE 113 (H) 02/26/2022 1550   BUN 16 02/26/2022 1550   CREATININE 0.72 02/26/2022 1550   CREATININE 0.68 05/21/2019 1550   CALCIUM 10.5 (H) 02/26/2022 1550   GFRNONAA >60 02/26/2022 1550   GFRAA >90 11/17/2012 1845    Lipid Panel  No results found for: "CHOL", "TRIG", "HDL", "CHOLHDL", "VLDL", "LDLCALC"  CBC    Component Value Date/Time   WBC 12.2 (H) 02/26/2022 1550   RBC 5.25 (H) 02/26/2022 1550   HGB 14.7 02/26/2022 1550   HGB 12.2 01/17/2017 0832   HCT 44.6 02/26/2022 1550   HCT 37.4 01/17/2017 0832   PLT 236 02/26/2022 1550   PLT 215 01/17/2017 0832   MCV 85.0 02/26/2022 1550   MCV 91 01/17/2017 0832   MCH 28.0 02/26/2022 1550   MCHC 33.0 02/26/2022 1550   RDW 12.4 02/26/2022 1550   RDW 13.6 01/17/2017 0832   LYMPHSABS 2.2 04/23/2017 1500   MONOABS 0.7 04/23/2017 1500   EOSABS 0.1 04/23/2017 1500   BASOSABS 0.0 04/23/2017 1500    Hgb A1C No results found for: "HGBA1C"         Assessment & Plan:   Preventative Health Maintenance:  She declines flu shot today Tetanus UTD Encouraged her to get a COVID-vaccine Pap smear scheduled with GYN Encouraged her to consume a balanced diet and exercise regimen Advised her to see a dentist annually We will check CBC, c-Met,TSH,  lipid, hep C today  Metallic Taste, Abnormal Smell, Fatigue, Weakness:  Will check  Vit D, B12, heavy metals  RTC in 1 year, sooner if needed Webb Silversmith, NP

## 2022-08-30 LAB — VITAMIN B12: Vitamin B-12: 491 pg/mL (ref 200–1100)

## 2022-08-30 LAB — CBC
HCT: 41.9 % (ref 35.0–45.0)
Hemoglobin: 14.1 g/dL (ref 11.7–15.5)
MCH: 29.6 pg (ref 27.0–33.0)
MCHC: 33.7 g/dL (ref 32.0–36.0)
MCV: 88 fL (ref 80.0–100.0)
MPV: 10.1 fL (ref 7.5–12.5)
Platelets: 256 10*3/uL (ref 140–400)
RBC: 4.76 10*6/uL (ref 3.80–5.10)
RDW: 12.1 % (ref 11.0–15.0)
WBC: 5.6 10*3/uL (ref 3.8–10.8)

## 2022-08-30 LAB — LIPID PANEL
Cholesterol: 188 mg/dL (ref <200)
HDL: 84 mg/dL (ref >=50)
LDL Cholesterol (Calc): 90 mg/dL (calc)
Non-HDL Cholesterol (Calc): 104 mg/dL (calc) (ref <130)
Total CHOL/HDL Ratio: 2.2 (calc) (ref <5.0)
Triglycerides: 59 mg/dL (ref <150)

## 2022-08-30 LAB — HEAVY METALS PANEL, BLOOD
Arsenic: <3 mcg/L (ref <23)
Lead: 1.7 ug/dL (ref <3.5)
Mercury, B: <4 mcg/L (ref <=10)

## 2022-08-30 LAB — COMPLETE METABOLIC PANEL WITH GFR
AG Ratio: 1.9 (calc) (ref 1.0–2.5)
ALT: 11 U/L (ref 6–29)
AST: 14 U/L (ref 10–30)
Albumin: 4.5 g/dL (ref 3.6–5.1)
Alkaline phosphatase (APISO): 55 U/L (ref 31–125)
BUN: 13 mg/dL (ref 7–25)
CO2: 29 mmol/L (ref 20–32)
Calcium: 10.3 mg/dL — ABNORMAL HIGH (ref 8.6–10.2)
Chloride: 102 mmol/L (ref 98–110)
Creat: 0.72 mg/dL (ref 0.50–0.97)
Globulin: 2.4 g/dL (calc) (ref 1.9–3.7)
Glucose, Bld: 75 mg/dL (ref 65–99)
Potassium: 3.5 mmol/L (ref 3.5–5.3)
Sodium: 139 mmol/L (ref 135–146)
Total Bilirubin: 0.6 mg/dL (ref 0.2–1.2)
Total Protein: 6.9 g/dL (ref 6.1–8.1)
eGFR: 111 mL/min/{1.73_m2} (ref >=60)

## 2022-08-30 LAB — VITAMIN D 25 HYDROXY (VIT D DEFICIENCY, FRACTURES): Vit D, 25-Hydroxy: 54 ng/mL (ref 30–100)

## 2022-08-30 LAB — HEPATITIS C ANTIBODY: Hepatitis C Ab: NONREACTIVE

## 2022-08-30 LAB — TSH: TSH: 2.18 mIU/L

## 2022-10-07 ENCOUNTER — Ambulatory Visit: Payer: Self-pay | Admitting: Internal Medicine

## 2022-10-24 ENCOUNTER — Other Ambulatory Visit: Payer: Self-pay | Admitting: Internal Medicine

## 2022-10-24 MED ORDER — AZITHROMYCIN 250 MG PO TABS: ORAL_TABLET | ORAL | 0 refills | Status: DC

## 2022-10-30 ENCOUNTER — Encounter: Payer: Self-pay | Admitting: Obstetrics and Gynecology

## 2022-12-06 ENCOUNTER — Ambulatory Visit (INDEPENDENT_AMBULATORY_CARE_PROVIDER_SITE_OTHER): Payer: Self-pay | Admitting: Internal Medicine

## 2022-12-06 ENCOUNTER — Encounter: Payer: Self-pay | Admitting: Internal Medicine

## 2022-12-06 ENCOUNTER — Other Ambulatory Visit (HOSPITAL_COMMUNITY)
Admission: RE | Admit: 2022-12-06 | Discharge: 2022-12-06 | Disposition: A | Discharge status: Home / Self Care | Payer: Self-pay | Source: Ambulatory Visit | Attending: Internal Medicine | Admitting: Internal Medicine

## 2022-12-06 VITALS — BP 104/62 | HR 70 | Temp 97.5°F | Wt 123.0 lb

## 2022-12-06 DIAGNOSIS — N898 Other specified noninflammatory disorders of vagina: Secondary | ICD-10-CM

## 2022-12-06 DIAGNOSIS — M545 Low back pain, unspecified: Secondary | ICD-10-CM

## 2022-12-06 DIAGNOSIS — R3989 Other symptoms and signs involving the genitourinary system: Secondary | ICD-10-CM

## 2022-12-06 DIAGNOSIS — R82998 Other abnormal findings in urine: Secondary | ICD-10-CM

## 2022-12-06 LAB — POCT URINALYSIS DIPSTICK
Bilirubin, UA: NEGATIVE
Blood, UA: NEGATIVE
Glucose, UA: NEGATIVE
Ketones, UA: NEGATIVE
Leukocytes, UA: NEGATIVE
Nitrite, UA: NEGATIVE
Protein, UA: NEGATIVE
Spec Grav, UA: 1.01 (ref 1.010–1.025)
Urobilinogen, UA: 0.2 E.U./dL
pH, UA: 6.5 (ref 5.0–8.0)

## 2022-12-06 NOTE — Progress Notes (Signed)
HPI  Pt presents to the clinic today with c/o dark urine, lower abdominal pain and low back pain. This started 5 days ago. She describes the pain as burning and pressure. She has a sensation that her bladder is full and feels like she can not empty her bladder completely. She denies urgency, frequency or blood in her urine. She does report vaginal discharge. The discharge is white without odor. She denies vaginal itching. She denies fever, chills, nausea or vomiting. She has increased her water intake. She has not taken anything OTC for this.     Review of Systems  Past Medical History:  Diagnosis Date   Acne 06/15/2015   Anxiety    Anxiety and depression 06/15/2015   Ovarian cyst    Subchorionic hemorrhage in first trimester     Family History  Adopted: Yes  Problem Relation Age of Onset   Drug abuse Mother    Other Neg Hx     Social History   Socioeconomic History   Marital status: Married    Spouse name: Not on file   Number of children: Not on file   Years of education: Not on file   Highest education level: Not on file  Occupational History   Not on file  Tobacco Use   Smoking status: Never   Smokeless tobacco: Never  Substance and Sexual Activity   Alcohol use: No    Alcohol/week: 0.0 standard drinks of alcohol    Comment: occasional   Drug use: No   Sexual activity: Not Currently    Birth control/protection: None, Surgical    Comment: spouse had vasectomy  Other Topics Concern   Not on file  Social History Narrative   Not on file   Social Determinants of Health   Financial Resource Strain: Not on file  Food Insecurity: Not on file  Transportation Needs: Not on file  Physical Activity: Not on file  Stress: Not on file  Social Connections: Not on file  Intimate Partner Violence: Not on file    Allergies  Allergen Reactions   Augmentin [Amoxicillin-Pot Clavulanate] Hives    Rash, itching   Lexapro [Escitalopram] Rash     Constitutional: Denies fever,  malaise, fatigue, headache or abrupt weight changes.   GU: Pt reports bladder pressure, low back pain, dark urine and vaginal discharge. Denies urgency, frequency, burning sensation, blood in urine, odor.. Skin: Denies redness, rashes, lesions or ulcercations.   No other specific complaints in a complete review of systems (except as listed in HPI above).    Objective:   Physical Exam BP 104/62 (BP Location: Left Arm, Patient Position: Sitting, Cuff Size: Normal)   Pulse 70   Temp (!) 97.5 F (36.4 C) (Temporal)   Wt 123 lb (55.8 kg)   SpO2 99%   BMI 20.79 kg/m   Wt Readings from Last 3 Encounters:  08/28/22 123 lb (55.8 kg)  02/26/22 126 lb 15.8 oz (57.6 kg)  01/23/21 126 lb 15.8 oz (57.6 kg)    General: Appears her stated age, well developed, well nourished in NAD. Cardiovascular: Normal rate and rhythm. S1,S2 noted.   Pulmonary/Chest: Normal effort and positive vesicular breath sounds. No respiratory distress. No wheezes, rales or ronchi noted.  Abdomen: Soft. Normal bowel sounds. No distention or masses noted.  Tender to palpation over the bladder area. No CVA tenderness. Pelvic: Self Swab       Assessment & Plan:   Bladder Pressure, Low Back Pain, Dark Urine, Vaginal Discharge:  Urinalysis:  normal No indication to send urine culture Drink plenty of fluids Will send wet prep  RTC in 9 months for annual exam Webb Silversmith, NP

## 2022-12-06 NOTE — Patient Instructions (Signed)
Clean Catch Urine Collection The clean catch urine collection method is a way of collecting a urine sample for lab testing. Using a clean catch method reduces the chance that bacteria and other fluids from the genital area will be collected in the urine sample. Many tests may be performed on the urine sample to help you and your health care provider determine appropriate treatment for your condition. The collection method includes: Cleaning your genital area. Collecting midstream urine. It is important to catch the middle part of the urine flow. Securing the sample for lab testing. General tips If you are asked to collect a urine sample at home, make sure you: Use supplies and instructions that you received from the lab. Collect urine only in the germ-free (sterile) cup that you received from the lab. Do not let any toilet paper or stool (feces) get into the cup. Refrigerate the sample until you can return it to the lab. It is up to you to get the results of your test. Ask your health care provider, or the department that is doing the test, when your results will be ready. Supplies needed: You will need the following supplies: Soap and water. Cleansing wipes. Collection container. How to collect a clean catch urine sample  The clean catch collection method varies slightly for women, men, and infants. Females Wash your hands with soap and water for at least 20 seconds. Place the cleansing wipes and collection container within reach. Open the collection container and place the lid with the flat side down. Be careful not to touch the inside of the lid. Spread your legs open and separate the inner folds of skin at the entrance of the vagina (labia minora). Clean your genital area: Take the first cleansing wipe and wipe from front to back inside your labia. Throw the cleansing wipe away. Take the second cleansing wipe and clean around the middle area of your genitals. Continue to hold your  labial skin folds open while collecting the sample: Urinate a small amount into the toilet, then stop the flow. Hold the collection container under your genital area. Urinate into the collection container until it is about half full, then stop. Set the collection container down. Let go of your labial folds and finish urinating into the toilet. Secure the lid onto the collection container. Avoid touching the inside of the lid and collection container. Wash your hands with soap and water for at least 20 seconds. Label the collection container as told. If you are at home, keep the sample refrigerated until you take it to the lab. Males Wash your hands with soap and water for at least 20 seconds. Place the cleansing wipes and collection container within reach. Open the collection container and place the lid with the flat side down. Be careful not to touch the inside of the lid. Clean your genital area: Take a cleansing wipe and clean all around the tip of your penis. Make sure the foreskin is pulled back away from the opening, if necessary. Throw the cleansing wipe away. Collect the sample: Urinate a small amount into the toilet, then stop the flow. Hold the collection container close to the opening of your penis. Urinate into the collection container until it is about half full, then stop. Set the collection container down. Finish urinating into the toilet. Secure the lid onto the collection container. Avoid touching the inside of the lid and collection container. Wash your hands with soap and water for at least 20 seconds. Label  the collection container as directed. If you are at home, keep the sample refrigerated until you take it to the lab. Infants Wash your hands with soap and water for at least 20 seconds. Open the collection bag. Wash your infant's genital area with cleansing wipes. Do not apply any ointments or antiseptics immediately before cleansing. Place the collection bag  over the penis or labia. Attach the adhesive to your infant's skin. Place a new diaper on your infant over the collection bag. Wash your hands with soap and water for at least 20 seconds. Monitor your baby. Remove the collection bag after your infant has urinated. You may need to transfer the urine into a collection container. Label the collection container as directed. If you are at home, keep the sample refrigerated until you take it to the lab. Summary The clean catch urine collection method is a way of collecting a urine sample for lab testing. Using a clean catch method reduces the chance that bacteria and other fluids from the genital area will be collected in the urine sample. The clean catch collection method varies slightly for women, men, and infants. This information is not intended to replace advice given to you by your health care provider. Make sure you discuss any questions you have with your health care provider. Document Revised: 08/10/2020 Document Reviewed: 06/09/2020 Else[employer] Patient Education  [employer].

## 2022-12-10 LAB — CERVICOVAGINAL ANCILLARY ONLY
Bacterial Vaginitis (gardnerella): NEGATIVE
Candida Glabrata: NEGATIVE
Candida Vaginitis: NEGATIVE
Comment: NEGATIVE
Comment: NEGATIVE
Comment: NEGATIVE

## 2023-07-25 IMAGING — CT CT ABD-PELV W/ CM
2 of 4 series · 16 of 46 positions shown, 18 images · IV contrast (APPLIED)
Comparison: CT abdomen and pelvis dated January 23, 2021

CLINICAL DATA: Lower quadrant abdominal pain

EXAM:
CT ABDOMEN AND PELVIS WITH CONTRAST
TECHNIQUE: Multidetector CT imaging of the abdomen and pelvis was performed
using the standard protocol following bolus administration of
intravenous contrast.

[Series 2: abd pel w · axial · 0.71mm/px · z∈[+781,+1186]mm · 13 of 89 slices shown, 15 images]
[im 4/89  soft-tissue]
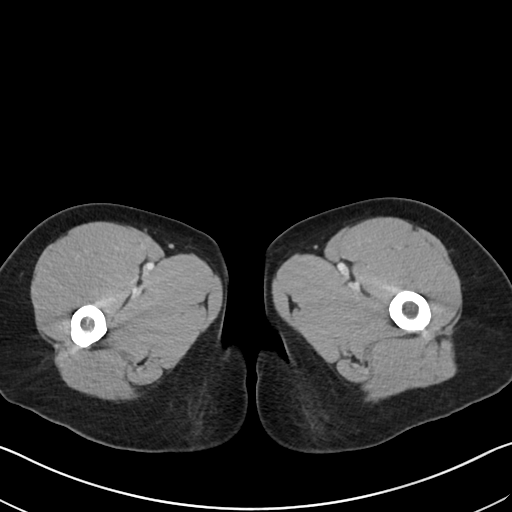
[im 4/89  bone]
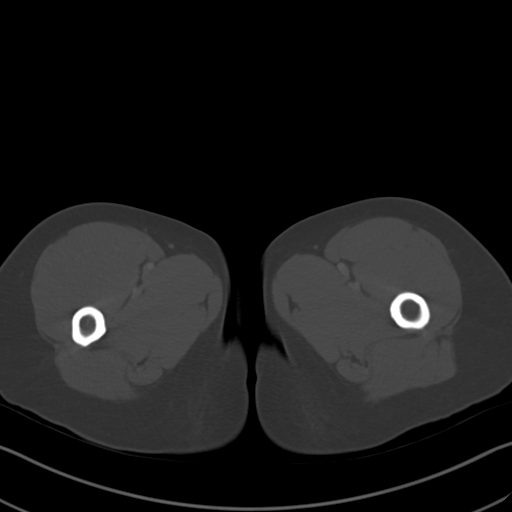
[im 11/89  soft-tissue]
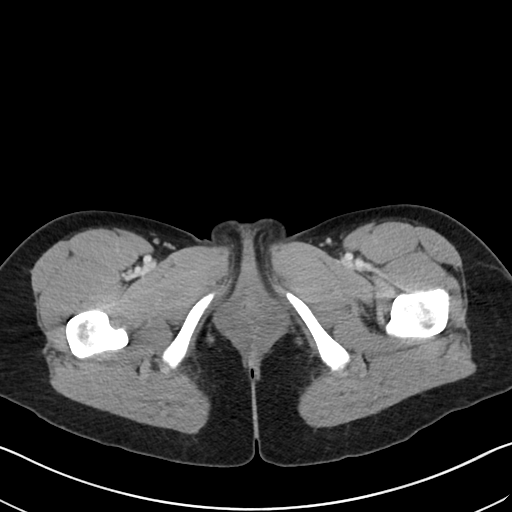
[im 17/89  soft-tissue]
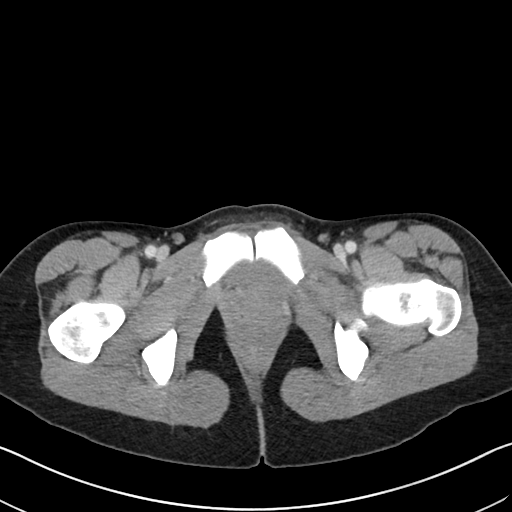
[im 24/89  soft-tissue]
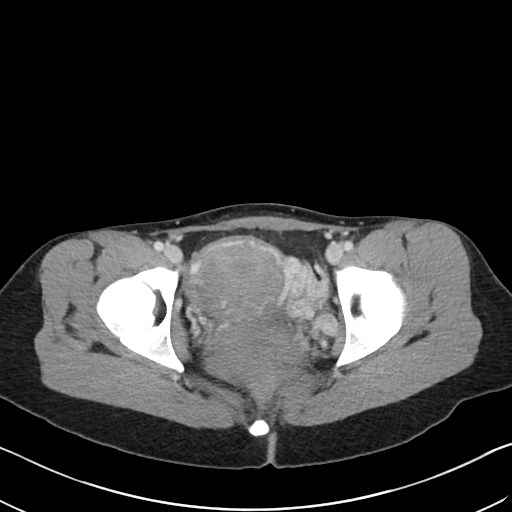
[im 31/89  soft-tissue]
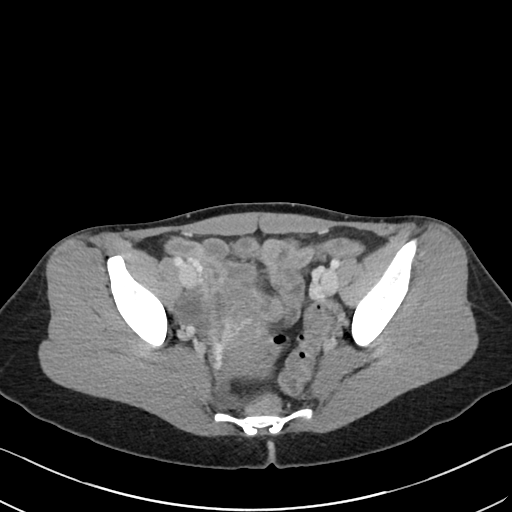
[im 38/89  soft-tissue]
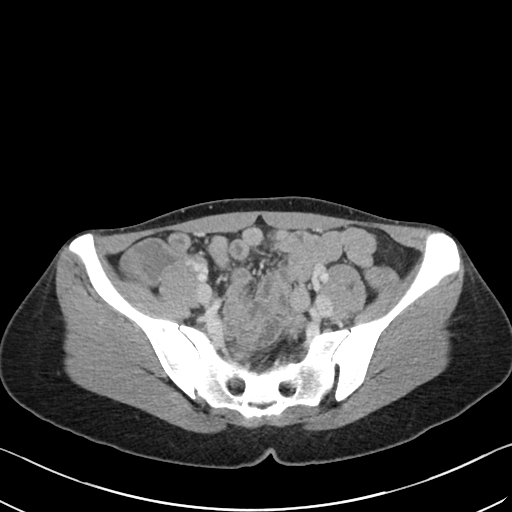
[im 45/89  soft-tissue]
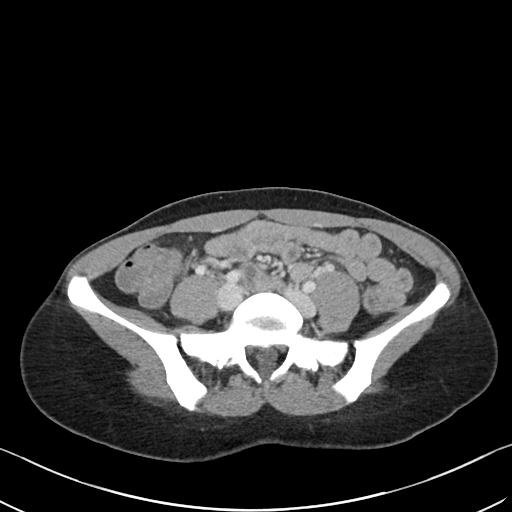
[im 51/89  soft-tissue]
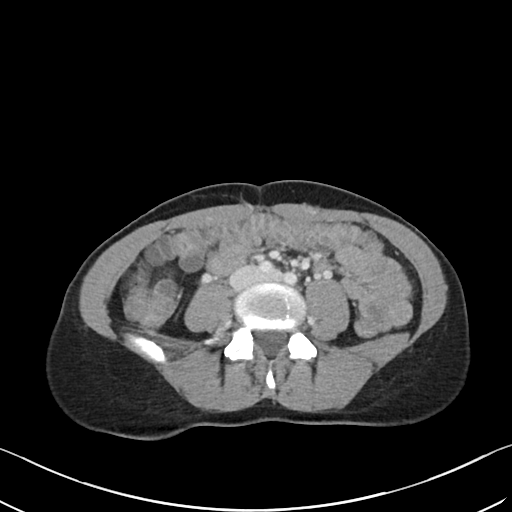
[im 58/89  soft-tissue]
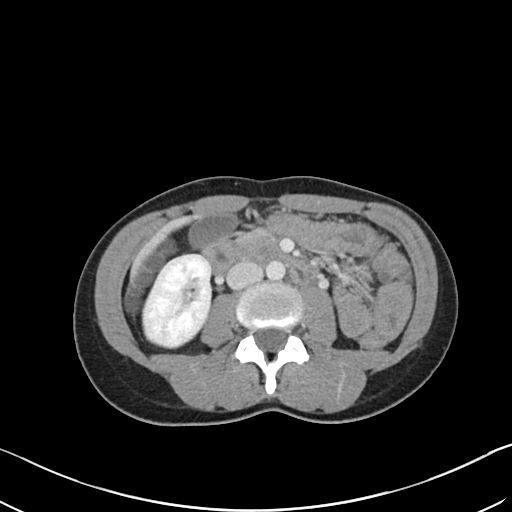
[im 58/89  bone]
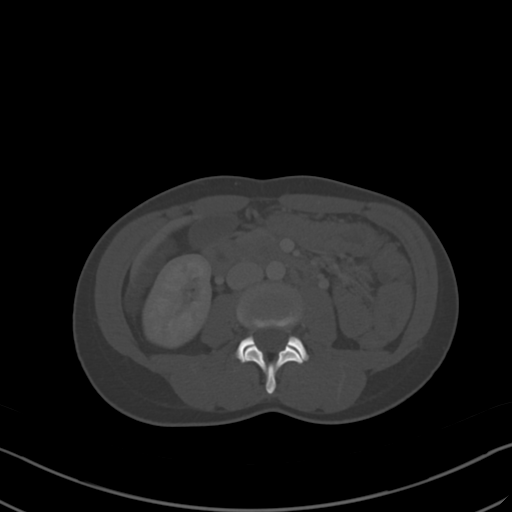
[im 65/89  soft-tissue]
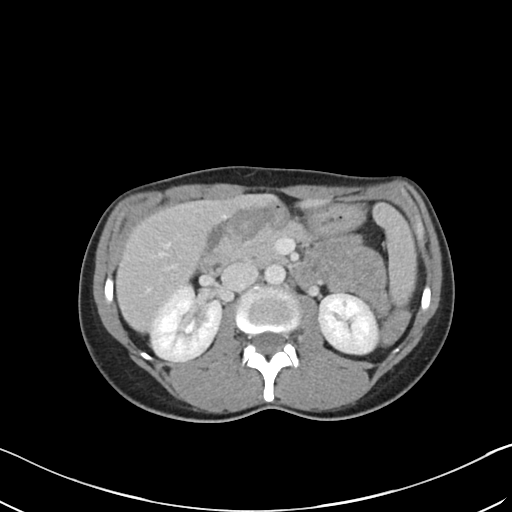
[im 72/89  soft-tissue]
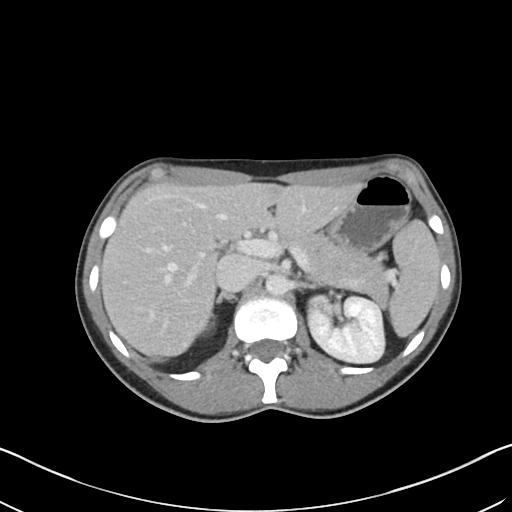
[im 78/89  soft-tissue]
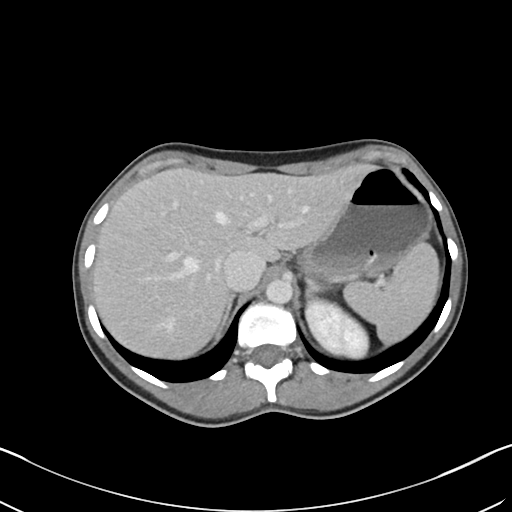
[im 85/89  soft-tissue]
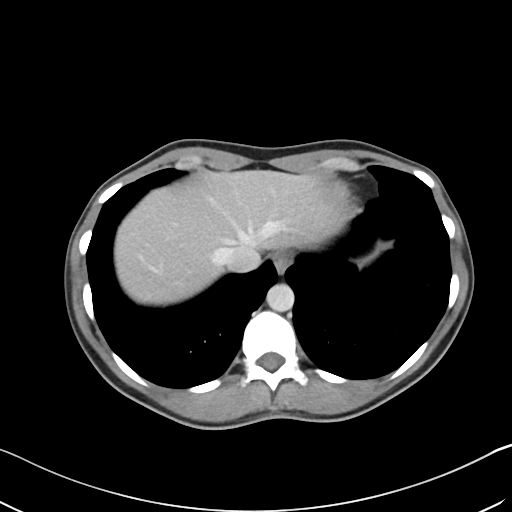

[Series 5: coronal · coronal · 0.79mm/px · 3 of 80 slices shown]
[im 27/80  soft-tissue]
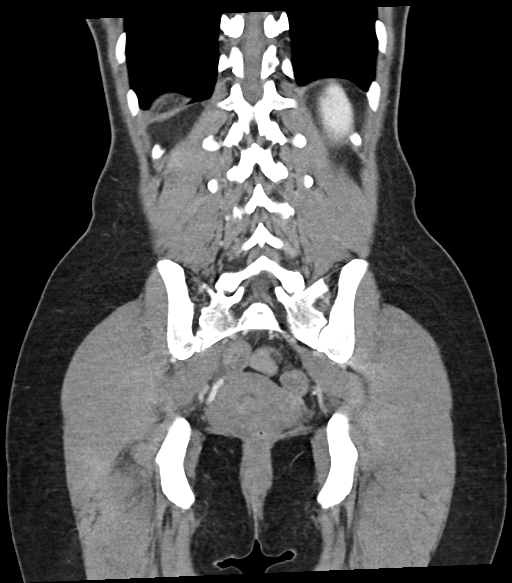
[im 36/80  soft-tissue]
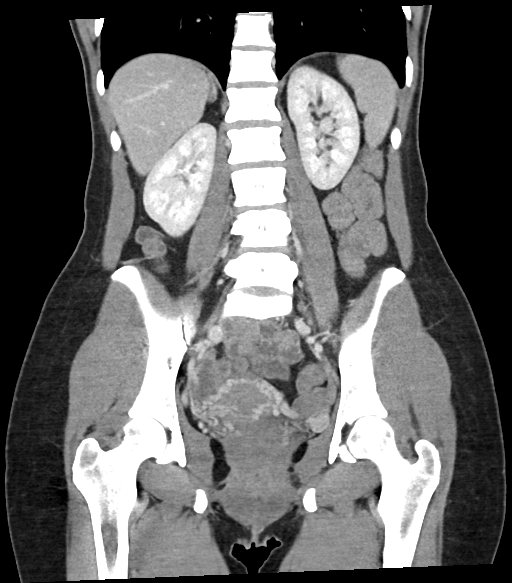
[im 44/80  soft-tissue]
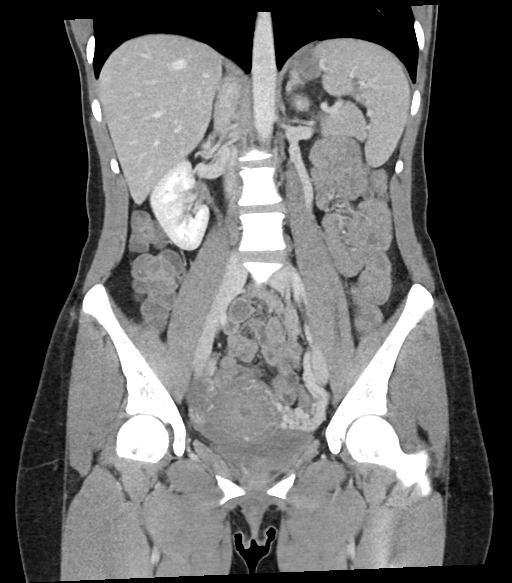

[16 of 46 positions shown; findings below may reference images not displayed]

RADIATION DOSE REDUCTION: This exam was performed according to the
departmental dose-optimization program which includes automated
exposure control, adjustment of the mA and/or kV according to
patient size and/or use of iterative reconstruction technique.

CONTRAST:  75mL OMNIPAQUE IOHEXOL 300 MG/ML  SOLN
FINDINGS: Lower chest: No acute abnormality.

Hepatobiliary: No focal liver abnormality is seen. No gallstones,
gallbladder wall thickening, or biliary dilatation.

Pancreas: Unremarkable. No pancreatic ductal dilatation or
surrounding inflammatory changes.

Spleen: Normal in size without focal abnormality.

Adrenals/Urinary Tract: Bilateral adrenal glands are unremarkable.
Kidneys enhance symmetrically with no evidence of hydronephrosis or
nephrolithiasis. Bladder is decompressed.

Stomach/Bowel: Stomach is within normal limits. Appendix appears
normal. No evidence of bowel wall thickening, distention, or
inflammatory changes.

Vascular/Lymphatic: No significant vascular findings are present. No
enlarged abdominal or pelvic lymph nodes.

Reproductive: Simple appearing right adnexal cyst measuring 2.5 cm
on series 2, image 60, likely physiologic with no further follow-up
needed. Dilated myometrial and left greater than right adnexal
veins.

Other: No abdominal wall hernia or abnormality. No abdominopelvic
ascites.

Musculoskeletal: No acute or significant osseous findings.
IMPRESSION: 1. No acute findings in the abdomen or pelvis.
2. Dilated myometrial and left greater than right adnexal veins,
findings are similar to prior exam and can be seen in the setting
pelvic congestion syndrome.

## 2023-09-19 ENCOUNTER — Ambulatory Visit (INDEPENDENT_AMBULATORY_CARE_PROVIDER_SITE_OTHER): Payer: Self-pay | Admitting: Family Medicine

## 2023-09-19 ENCOUNTER — Encounter: Payer: Self-pay | Admitting: Family Medicine

## 2023-09-19 VITALS — BP 110/70 | HR 76 | Temp 97.7°F | Ht 64.5 in | Wt 125.4 lb

## 2023-09-19 DIAGNOSIS — J0110 Acute frontal sinusitis, unspecified: Secondary | ICD-10-CM

## 2023-09-19 DIAGNOSIS — H6993 Unspecified Eustachian tube disorder, bilateral: Secondary | ICD-10-CM

## 2023-09-19 DIAGNOSIS — J029 Acute pharyngitis, unspecified: Secondary | ICD-10-CM

## 2023-09-19 LAB — POCT RAPID STREP A (OFFICE): Rapid Strep A Screen: NEGATIVE

## 2023-09-19 MED ORDER — FLUTICASONE PROPIONATE 50 MCG/ACT NA SUSP: 2.0000 | Freq: Every day | NASAL | 0 refills | Status: AC

## 2023-09-19 MED ORDER — AMOXICILLIN 500 MG PO CAPS: 500.0000 mg | ORAL_CAPSULE | Freq: Two times a day (BID) | ORAL | 0 refills | Status: AC

## 2023-09-19 NOTE — Patient Instructions (Addendum)
Thank you for coming to the office today.  1. It sounds like you have a Sinusitis (Bacterial Infection) - this most likely started as an Upper Respiratory Virus that has settled into an infection. Allergies can also cause this.  Rapid Strep test was negative.  Start Amoxicillin 500mg  twice a day for 10 days, this will cover against possible strep even though the test was negative.  Start nasal steroid Flonase 2 sprays in each nostril daily for 4-6 weeks, may repeat course seasonally or as needed  May use allergy med OTC if needed  Continue current remedy at home with the salt water gargles and the herbal tea w honey  - Improve hydration by drinking plenty of clear fluids (water, gatorade) to reduce secretions and thin congestion    Please schedule a Follow-up Appointment to: Return if symptoms worsen or fail to improve.  If you have any other questions or concerns, please feel free to call the office or send a message through MyChart. You may also schedule an earlier appointment if necessary.  Additionally, you may be receiving a survey about your experience at our office within a few days to 1 week by e-mail or mail. We value your feedback.  Saralyn Pilar, DO [facility], [place]

## 2023-09-19 NOTE — Progress Notes (Signed)
Subjective:    Patient ID: Anna [name], female    DOB: 1986/05/18, 37 y.o.   MRN: [id number]  Anna [name] is a 37 y.o. female presenting on 09/19/2023 for Sore Throat (X 2 weeks, fatigue, ear pressure. )  Patient presents for a same day appointment.  PCP Nicki Reaper, FNP   HPI  Discussed the use of AI scribe software for clinical note transcription with the patient, who gave verbal consent to proceed  Pharyngitis / Sinusitis   The patient, with a two-week history of symptoms, presents with fatigue, ear pressure, and a persistent sore throat. The sore throat is described as the most bothersome symptom, with varying intensity but never fully resolving. The patient denies any sick contacts, fevers, chills, body aches, or digestive symptoms. She reports experiencing headaches and ear pain, along with discomfort in the neck and back. The patient denies any productive cough or congestion, but mentions a sensation of phlegm that does not induce coughing. She also reports a general feeling of being worn out and tired.  She has been managing her symptoms with natural remedy options -  herbal teas, honey, and rare use of OTC analgesic  Not endorsing any active GERD symptoms.  The patient has a known intolerance (not allergy) to Augmentin but can tolerate Amoxicillin.   Denies any congestion from sinuses or productive Denies any cough or dyspnea Denies any fevers chills or body aches, nausea vomiting diarrhea.   Health Maintenance: No current updated COVID / Flu Vaccines.     09/19/2023    1:09 PM 12/06/2022    2:18 PM 08/28/2022    9:02 AM  Depression screen PHQ 2/9  Decreased Interest 0 0 0  Down, Depressed, Hopeless 0 0 0  PHQ - 2 Score 0 0 0    Social History   Tobacco Use   Smoking status: Never   Smokeless tobacco: Never  Substance Use Topics   Alcohol use: No    Alcohol/week: 0.0 standard drinks of alcohol    Comment: occasional   Drug use: No    Review of  Systems Per HPI unless specifically indicated above     Objective:    BP 110/70   Pulse 76   Temp 97.7 F (36.5 C)   Ht 5' 4.5" (1.638 m)   Wt 125 lb 6.4 oz (56.9 kg)   LMP 08/29/2023 (Exact Date)   SpO2 97%   BMI 21.19 kg/m   Wt Readings from Last 3 Encounters:  09/19/23 125 lb 6.4 oz (56.9 kg)  12/06/22 123 lb (55.8 kg)  08/28/22 123 lb (55.8 kg)    Physical Exam Vitals and nursing note reviewed.  Constitutional:      General: She is not in acute distress.    Appearance: Normal appearance. She is well-developed. She is not diaphoretic.     Comments: Well-appearing, comfortable, cooperative  HENT:     Head: Normocephalic and atraumatic.     Right Ear: Ear canal and external ear normal. There is no impacted cerumen.     Left Ear: Ear canal and external ear normal. There is no impacted cerumen.     Ears:     Comments: Bilateral TM fullness pressure. Mild clear effusion R>L no erythema or purulence.    Nose: Nose normal. No congestion.     Mouth/Throat:     Mouth: Mucous membranes are moist.     Pharynx: No oropharyngeal exudate or posterior oropharyngeal erythema.     Comments: Oropharyngeal evidence of post  nasal drainage. Non focal appearance. No asymmetry Eyes:     General:        Right eye: No discharge.        Left eye: No discharge.     Conjunctiva/sclera: Conjunctivae normal.  Cardiovascular:     Rate and Rhythm: Normal rate.     Pulses: Normal pulses.  Pulmonary:     Effort: Pulmonary effort is normal. No respiratory distress.     Breath sounds: Normal breath sounds. No stridor. No wheezing, rhonchi or rales.  Lymphadenopathy:     Cervical: No cervical adenopathy.  Skin:    General: Skin is warm and dry.     Findings: No erythema or rash.  Neurological:     Mental Status: She is alert and oriented to person, place, and time.  Psychiatric:        Mood and Affect: Mood normal.        Behavior: Behavior normal.        Thought Content: Thought content  normal.     Comments: Well groomed, good eye contact, normal speech and thoughts      Results for orders placed or performed in visit on 09/19/23  POCT rapid strep A  Result Value Ref Range   Rapid Strep A Screen Negative Negative      Assessment & Plan:   Problem List Items Addressed This Visit   None Visit Diagnoses     Acute non-recurrent frontal sinusitis    -  Primary   Relevant Medications   amoxicillin (AMOXIL) 500 MG capsule   fluticasone (FLONASE) 50 MCG/ACT nasal spray   Sore throat       Relevant Orders   POCT rapid strep A (Completed)   Eustachian tube dysfunction, bilateral           Acute Pharyngitis / Sinusitis Eustachian Tube Dysfunction R>L  2 weeks constellation, possible initial viral now worsening sinusitis seems post nasal drainage, sinus pressure No productive cough or congestion, but patient reports sensation of phlegm.  Rapid Strep Negative today  -Start Amoxicillin 500mg  twice daily for 10 days to cover potential strep infection. (No PCN Allergy) -Continue current home remedies (teas, honeys). -Add Flonase (generic) for 4-6 weeks to reduce sinus swelling and pressure.     Meds ordered this encounter  Medications   amoxicillin (AMOXIL) 500 MG capsule    Sig: Take 1 capsule (500 mg total) by mouth 2 (two) times daily. For 10 days    Dispense:  20 capsule    Refill:  0   fluticasone (FLONASE) 50 MCG/ACT nasal spray    Sig: Place 2 sprays into both nostrils daily. Use for 4-6 weeks then stop and use seasonally or as needed.    Dispense:  16 g    Refill:  0      Follow up plan: Return if symptoms worsen or fail to improve.   Saralyn Pilar, DO [facility] 09/19/2023, 10:02 AM

## 2024-03-19 ENCOUNTER — Other Ambulatory Visit: Payer: Self-pay | Admitting: Internal Medicine

## 2024-03-19 MED ORDER — PREDNISONE 10 MG PO TABS: 10.0000 mg | ORAL_TABLET | Freq: Every day | ORAL | 0 refills | Status: AC

## 2024-10-20 ENCOUNTER — Encounter: Payer: Self-pay | Admitting: Internal Medicine

## 2024-10-20 MED ORDER — DOXYCYCLINE HYCLATE 100 MG PO TABS: 100.0000 mg | ORAL_TABLET | Freq: Two times a day (BID) | ORAL | 0 refills | Status: AC
